# Patient Record
Sex: Female | Born: 1950 | Race: Black or African American | Hispanic: No | Smoking: Never smoker
Health system: Southern US, Community
[De-identification: ages and names within clinical notes are randomized; demographics above are authoritative.]

## PROBLEM LIST (undated history)

## (undated) DIAGNOSIS — K219 Gastro-esophageal reflux disease without esophagitis: Secondary | ICD-10-CM

## (undated) DIAGNOSIS — N189 Chronic kidney disease, unspecified: Secondary | ICD-10-CM

## (undated) DIAGNOSIS — M1990 Unspecified osteoarthritis, unspecified site: Secondary | ICD-10-CM

## (undated) HISTORY — PX: ABDOMINAL HYSTERECTOMY: SHX81

## (undated) HISTORY — DX: Unspecified osteoarthritis, unspecified site: M19.90

## (undated) HISTORY — PX: KIDNEY SURGERY: SHX687

## (undated) HISTORY — PX: CHOLECYSTECTOMY: SHX55

## (undated) HISTORY — DX: Gastro-esophageal reflux disease without esophagitis: K21.9

## (undated) HISTORY — DX: Chronic kidney disease, unspecified: N18.9

---

## 2013-10-08 ENCOUNTER — Ambulatory Visit (INDEPENDENT_AMBULATORY_CARE_PROVIDER_SITE_OTHER): Payer: Medicare Other

## 2013-10-08 VITALS — BP 109/83 | HR 80 | Resp 18

## 2013-10-08 DIAGNOSIS — B351 Tinea unguium: Secondary | ICD-10-CM

## 2013-10-08 DIAGNOSIS — L97509 Non-pressure chronic ulcer of other part of unspecified foot with unspecified severity: Secondary | ICD-10-CM

## 2013-10-08 DIAGNOSIS — G629 Polyneuropathy, unspecified: Secondary | ICD-10-CM

## 2013-10-08 DIAGNOSIS — M79609 Pain in unspecified limb: Secondary | ICD-10-CM

## 2013-10-08 DIAGNOSIS — I739 Peripheral vascular disease, unspecified: Secondary | ICD-10-CM

## 2013-10-08 NOTE — Progress Notes (Signed)
   Subjective:    Patient ID: Wanda Deleon, female    DOB: 1950-10-10, 63 y.o.   MRN: [id number]  HPI I had cellulitis on my left big toe and that was about a month ago and I used all the antibotics that they gave me and started to use vasoline cocoa butter and the nail is coming close to coming off and I saw Dr. Moshe Cipro my family doctor and went to wound clinic the one at [place] and they said to come here and then I wore a pair of shoes on the right foot and the wound clinic treated me for my right foot and then they released me and I also need my toenails trimmed but I am not a diabetic but I do go to dialysis     Review of Systems  All other systems reviewed and are negative.      Objective:   Physical Exam March the objective findings as follows vascular status DP thready at one over 4 bilateral PT nonpalpable bilateral temperature warm to cool turgor diminished there is no edema rubor pallor noted there is mild + edema noted both feet no rubor pallor noted neurologically epicritic and proprioceptive sensations appear to be intact although diminished on Semmes Weinstein testing to forefoot digits patient peripheral neuropathy affecting her hands as well patient is been on dialysis for more than 20 years steroid diagnosed with diabetes however cannot rule out as your dialysis in managing her diabetic a prediabetic state. Nails thick brittle crumbly criptotic incurvated friable 1 through 5 bilateral first left has been treated fairly have localized cellulitis which is treated with her antibiotics different treatments and this time the majority the nail plate is completely avulsed from the nailbed and minimal debridement as required remove the nail this time. There is cleansed with all cleansed Silvadene and gauze and Band-Aid dressing applied to the left hallux nail bed. There is also ulceration first MTP joint right about 2 mm ulceration down to the dermal subdermal junction was mild serous  drainage there is resolved ulcer the dorsum of the right foot her fourth metatarsal area with no active discharge or drainage noted mild flexible digital contractures are noted no other active open wounds or ulcers are noted of the first MTP area right       Assessment & Plan:  Assessment this time patient is that of diabetes does have or peripheral neuropathy as well as peripheral arterial disease and vascular compromise this time mycotic nails painful tender symptomatic 1 through 5 bilateral are debrided the left hallux nail plate is grade or debridement of the avulsed the nailbed is with all cleansed and Silvadene and Band-Aid dressing being applied. The ulcer sub-first left is also first right is also debrided and dressed with Silvadene and Band-Aid dressing maintain accommodative shoes no barefoot or flimsy shoes or flip-flops patient is again will follow him to dialysis we discussed the possibilities of other treatments however based on her age her history dialysis etc. she got strong candidate for other invasive options are treatments recheck in 2-3 months for followup and keep palliative care in the future maintain Silvadene gauze dressing starting have Silvadene home for the first MTP area right as well as the hallux nail that is not resolve completely within the next 3 or 4 weeks come in sooner again otherwise 2-3 month long-term followup for nail care  Alvan Dame DPM

## 2013-10-08 NOTE — Patient Instructions (Signed)

## 2013-10-29 ENCOUNTER — Ambulatory Visit (INDEPENDENT_AMBULATORY_CARE_PROVIDER_SITE_OTHER): Payer: Medicare Other

## 2013-10-29 ENCOUNTER — Other Ambulatory Visit: Payer: Self-pay

## 2013-10-29 VITALS — BP 128/72 | HR 87 | Temp 99.3°F | Resp 18

## 2013-10-29 DIAGNOSIS — L03119 Cellulitis of unspecified part of limb: Secondary | ICD-10-CM

## 2013-10-29 DIAGNOSIS — L97509 Non-pressure chronic ulcer of other part of unspecified foot with unspecified severity: Secondary | ICD-10-CM

## 2013-10-29 DIAGNOSIS — G609 Hereditary and idiopathic neuropathy, unspecified: Secondary | ICD-10-CM

## 2013-10-29 DIAGNOSIS — I739 Peripheral vascular disease, unspecified: Secondary | ICD-10-CM

## 2013-10-29 DIAGNOSIS — G629 Polyneuropathy, unspecified: Secondary | ICD-10-CM

## 2013-10-29 DIAGNOSIS — L02619 Cutaneous abscess of unspecified foot: Secondary | ICD-10-CM

## 2013-10-29 MED ORDER — CEPHALEXIN 500 MG PO CAPS: 500.0000 mg | ORAL_CAPSULE | Freq: Two times a day (BID) | ORAL | Status: DC

## 2013-10-29 MED ORDER — CIPROFLOXACIN HCL 250 MG PO TABS: 250.0000 mg | ORAL_TABLET | Freq: Two times a day (BID) | ORAL | Status: AC

## 2013-10-29 NOTE — Patient Instructions (Signed)

## 2013-10-29 NOTE — Progress Notes (Signed)
   Subjective:    Patient ID: Wanda Deleon, female    DOB: 07/16/1950, 63 y.o.   MRN: [id number]  HPI MY BIG TOENAIL IS TENDER AND RED AND SWOLLEN SOME AND SOME DRAINING TO IT    Review of Systems no new findings or systemic changes at this time    Objective:   Physical Exam 63 year old African Richard M. since this time for followup patient appears to be well-nourished well-developed oriented x3 however is on dialysis does have profound neuropathy the left great toe which he previously had partial nail avulsion has worsened over time patient Baird LyonsCasey was doing well until about 6 or 7 days ago and started to get darkened and discolored and more painful painful tender symptomatically on palpation at the proximal IP joint of the hallux plantar and medial and extending to the dorsum there is exfoliation skin or blistered skin around the entire proximal medial and lateral nail folds the distal tuft of the hallux appears to have a hemorrhagic blister or blood blister or hematoma cannot rule out a contusion patient is unaware bumping her hitting her toe however there is significant exfoliation of skin with malodor is serous drainage being noted to the medial IP joint area appears to be most significant with malodor and at this time following cleansing of deep swab of the deepest pocket was carried out for culture and sensitivity. The necrotic friable skin is debrided from the dorsal medial lateral surfaces plantar surface of the hallux it does go down to dermal level only no sinus tract is noted on the bone or capsule however the foot show significant signs of ischemic change again nonpalpable pedal pulses at this time DP or PT plus one the +2 edema noted I do feel there is concern for change in her vascular status patient seen a vascular clinic in [place] at this time is encouraged to followup with him ASAP she will contact me her today or tomorrow also contact our office so we can intercede and provide  appropriate referral if needed patient however is encouraged to get recheck for her vascular status as she is at risk for loss of her toe and gangrenous changes to her toe.       Assessment & Plan:  Assessment this time peripheral vascular disease peripheral neuropathy with ulceration of the hallux and hallux nail bed power appears to have a new injury or contusion of toe with subsequent blistering and abscess medial plantar dorsal of toe and distal tuft of the toe the necrotic tissue was debrided restricted for cephalexin and Cipro are reordered at this time for her pharmacy. Patient will maintain daily cleansing with antibacterial soap and water and Silvadene and gauze dressing changes patient will be recheck within 2 weeks to be out of town next week however if she has any further complications any worsening in severity fever chills increased drainage or bleeding she is to immediately to the emergency room or report to her renal doctor at dialysis. At this recheck in 2 weeks for followup antibiotic regimen initiated will continue with cleansing soap and water or Epsom salts and water, dry the area well and replace Silvadene and gauze dressings next trigger for Alvan Dameichard Sikora DPM

## 2013-11-02 LAB — CULTURE, ROUTINE-ABSCESS: Gram Stain: NONE SEEN

## 2013-11-12 ENCOUNTER — Ambulatory Visit: Payer: Medicare Other

## 2014-01-07 ENCOUNTER — Ambulatory Visit: Payer: Medicare Other

## 2014-03-25 ENCOUNTER — Ambulatory Visit (INDEPENDENT_AMBULATORY_CARE_PROVIDER_SITE_OTHER): Payer: Medicare Other

## 2014-03-25 VITALS — BP 162/79 | HR 86 | Resp 12

## 2014-03-25 DIAGNOSIS — L02619 Cutaneous abscess of unspecified foot: Secondary | ICD-10-CM

## 2014-03-25 DIAGNOSIS — B351 Tinea unguium: Secondary | ICD-10-CM

## 2014-03-25 DIAGNOSIS — I739 Peripheral vascular disease, unspecified: Secondary | ICD-10-CM

## 2014-03-25 DIAGNOSIS — L03119 Cellulitis of unspecified part of limb: Secondary | ICD-10-CM

## 2014-03-25 DIAGNOSIS — G629 Polyneuropathy, unspecified: Secondary | ICD-10-CM

## 2014-03-25 DIAGNOSIS — M79676 Pain in unspecified toe(s): Secondary | ICD-10-CM

## 2014-03-25 NOTE — Progress Notes (Signed)
   Subjective:    Patient ID: Wanda Deleon, female    DOB: 12/20/1950, 63 y.o.   MRN: [id number]  HPI ''TOENAILS TRIM.'' B/L  PT STATED LAST TIME SEEING DR.BRITT ORDERED VASCULAR DONE AND FINE OUT LT FOOT GREAT TOE HAVE BONE INFECTION. PT IS SEND FOR EMERGENCY LT FOOT GREAT TOE [facility] June.   Review of Systemsno new findings or systemic changes noted     Objective:   Physical Exam [name] options this time for follow-up since he was here last in June has had amputation of her left great toe. Apparently last visit here she did have infection the toes it started however when she left she was seen by primary physician and was placed in the hospital where Doppler studies were done and patient underwent amputation of the toe for bone infection. Patient was hospital plus repair time on antibiotics and dressing changes the hallux indications site left foot is healed well and teased have thick brittle crumbly dystrophic criptotic nails 9 the assistance of a walker continues to have notable HAV deformity with keratoses first MTP area right no open wounds no ulcers no secondary infections current time is dark discolored brittle crumbly friable tender both on palpation with enclosed shoe wear. Pedal pulses are difficult are thready nonpalpable at this time DP or PT edema is +1 noted skin temperature is warm no signs of fever chills no signs of infections are noted       Assessment & Plan:  Assessment this time history peripheral vascular compromise as well as peripheral neuropathy ulceration of left hallux and up with an amputation of left great toe however this time continues to have mycotic brittle crumbly dystrophic nails on through 5 right 2 through 5 left these are debrided at this time. Maintaining cream or lotion to the bunion site where there is some mild keratoses although no open wounds no ulcers no secondary infection is noted did  dispensed some tube foam padding to cushion her bunion shield for her bunion of her right foot and recommend wide accommodative shoes at all times. Recheck in 3 months for follow-up and continued palliative nail care in the future as needed contact nails debrided 9   Alvan Dameichard Arlicia Paquette DPM

## 2014-03-25 NOTE — Patient Instructions (Signed)

## 2014-06-17 ENCOUNTER — Ambulatory Visit (INDEPENDENT_AMBULATORY_CARE_PROVIDER_SITE_OTHER): Payer: Medicare Other

## 2014-06-17 VITALS — BP 132/92 | HR 86 | Resp 12

## 2014-06-17 DIAGNOSIS — L02619 Cutaneous abscess of unspecified foot: Secondary | ICD-10-CM

## 2014-06-17 DIAGNOSIS — L03119 Cellulitis of unspecified part of limb: Secondary | ICD-10-CM

## 2014-06-17 DIAGNOSIS — G629 Polyneuropathy, unspecified: Secondary | ICD-10-CM | POA: Diagnosis not present

## 2014-06-17 DIAGNOSIS — R238 Other skin changes: Secondary | ICD-10-CM | POA: Diagnosis not present

## 2014-06-17 DIAGNOSIS — I739 Peripheral vascular disease, unspecified: Secondary | ICD-10-CM

## 2014-06-17 MED ORDER — CEPHALEXIN 500 MG PO CAPS: 500.0000 mg | ORAL_CAPSULE | Freq: Two times a day (BID) | ORAL | Status: AC

## 2014-06-17 MED ORDER — SILVER SULFADIAZINE 1 % EX CREA: 1.0000 "application " | TOPICAL_CREAM | Freq: Every day | CUTANEOUS | Status: AC

## 2014-06-17 NOTE — Patient Instructions (Signed)
ANTIBACTERIAL SOAP INSTRUCTIONS  THE DAY AFTER PROCEDURE  Please follow the instructions your doctor has marked.   Shower as usual. Before getting out, place a drop of antibacterial liquid soap (Dial) on a wet, clean washcloth.  Gently wipe washcloth over affected area.  Afterward, rinse the area with warm water.  Blot the area dry with a soft cloth and cover with antibiotic ointment (neosporin, polysporin, bacitracin) and band aid or gauze and tape  Place 3-4 drops of antibacterial liquid soap in a quart of warm tap water.  Submerge foot into water for 20 minutes.  If bandage was applied after your procedure, leave on to allow for easy lift off, then remove and continue with soak for the remaining time.  Next, blot area dry with a soft cloth and cover with a bandage.  Apply other medications as directed by your doctor, such as cortisporin otic solution (eardrops) or neosporin antibiotic ointment   Wash the foot daily dry thoroughly apply Silvadene and gauze dressing daily as instructed

## 2014-06-17 NOTE — Progress Notes (Signed)
   Subjective:    Patient ID: Rondell ReamsGearldlyn Berrian, female    DOB: 02/12/1951, 64 y.o.   MRN: [id number]  HPI   RT FOOT ULCER IS [facility].  ALSO, RT FOOT HAVE NEW BLISTER BOTTOM OF THE FOOT AFTER WEARING REEBOK SHOES AND THE BLISTER HAVE REDNESS AND DRANIGE FOR 3 DAYS. TRIED NO TREATMENT.   Review of Systems  Cardiovascular: Positive for leg swelling.  Musculoskeletal: Positive for gait problem.  All other systems reviewed and are negative.      Objective:   Physical Exam Vascular status unchanged this is a 64 year old afterward the options this time for follow-up previously for debridement/is scheduled for nail debridement next week however has developed a blister over the medial and plantar surface of her right foot has an ulcer about half centimeter diameter the first MTP area dorsal medial however proximal to that and extending for at least 8-10 cm with about a 3 cm 4 cm with was a large bullous lesion down to the dermal level this is not a folded partial-thickness blister through multiple layers of's epidermal and dermal layers. Mild serous drainage no purulence no malodor although the area somewhat warm to touch and palpation. No history of injury trauma although she indicates she were [employer] shoe which may have rubbed or injured this area. Her of rubber clogs injection do not fit appropriately. Patient is a history of peripheral neuropathy and angiopathy status post appendectomy dictation left hallux pulses thready nonpalpable DP or PT thready nonpalpable pulses bilateral's skin temperature warm to cool on the left warm on the right particular run the first MTP area no ascending cellulitis or lymphangitis localized cellulitis identified. S1 pitting noted rigid digital contractures are identified.       Assessment & Plan:  Assessment there is history peripheral neuropathy and angiopathy [place] compromise at this time and the blisters identified weathers traumatic or nontraumatic  insert determined multiple areas of skin are debrided away at this time with some mild serous drainage being identified has been draining or bleeding for couple of days nail was likely contaminated blister cannot rule out an infected blister of the right foot. This blister/large ulcer at least 10 x 3 cm in diameter with a 1 cm deep ulcer down to subcutaneous tissue level at the first MTP joint being noted these areas are debrided cleansed with all cleanse Silvadene gauze dressing applied to the entire right forefoot midfoot. Week for follow-up prescription for cephalexin is given a prescription for Silvadene is also given instructions for wound care daily are also dispensed reappointed one week plan for nail care and debridement at that visit as well as ulcer check. Contact us immediately or go to the emergency room if there's any exacerbations any systemic signs of fever chills etc. Extended Darco shoe to help offload pressure on the area as her other shoes do not fit appropriately  Alvan Dameichard Felipe Paluch DPM

## 2014-06-24 ENCOUNTER — Ambulatory Visit: Payer: Medicare Other

## 2014-06-24 ENCOUNTER — Ambulatory Visit (INDEPENDENT_AMBULATORY_CARE_PROVIDER_SITE_OTHER): Payer: Medicare Other

## 2014-06-24 VITALS — BP 110/60 | HR 86 | Resp 12

## 2014-06-24 DIAGNOSIS — I739 Peripheral vascular disease, unspecified: Secondary | ICD-10-CM

## 2014-06-24 DIAGNOSIS — L03119 Cellulitis of unspecified part of limb: Secondary | ICD-10-CM | POA: Diagnosis not present

## 2014-06-24 DIAGNOSIS — L02619 Cutaneous abscess of unspecified foot: Secondary | ICD-10-CM | POA: Diagnosis not present

## 2014-06-24 DIAGNOSIS — G629 Polyneuropathy, unspecified: Secondary | ICD-10-CM | POA: Diagnosis not present

## 2014-06-24 DIAGNOSIS — M86171 Other acute osteomyelitis, right ankle and foot: Secondary | ICD-10-CM

## 2014-06-24 DIAGNOSIS — R238 Other skin changes: Secondary | ICD-10-CM

## 2014-06-24 NOTE — Addendum Note (Signed)
Addended by: Shelly BombardPOLK, Natan Hartog M on: 06/24/2014 04:19 PM   Modules accepted: Orders

## 2014-06-24 NOTE — Progress Notes (Signed)
   Subjective:    Patient ID: Wanda Deleon, female    DOB: 07/13/1950, 64 y.o.   MRN: [id number]  HPI  ''RT FOOT IS LOOKING A LITTLE BETTER AND JUST THE ONE SPOT STILL LOOKS THE SAME. ALSO, THE SKIN IS PEELING OFF SOME OF THE AREA OF THE FOOT.''  Review of Systems no new findings or systemic changes noted    Objective:   Physical Exam Lower extremity findings unchanged patient does have blistering of right foot with a draining serosanguineous abscess or sinus tract exiting from the first MTP joint of the right great toe. There is localized edema and erythema and a size lymphangitis significant fluctuance around the first MTP and plantar foot and forefoot are noted. Her is crepitus on movement of the hallux likely bony erosion of the MTP joint possible acute osteomyelitis and deterioration of the MTP joint right foot. She has profound neuropathy. Has been on antibiotic regimen however by recommendation at this time is [facility] recommendations for hospital admission with IV antibiotic therapies and a workup for foot abscess and ulceration.      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 She is referred to the hospital immediately will drive herself at this time will make contact with the ER to announce her arrival at the emergency room. Again recommendation will be for likely admit IV antibiotic therapies and likely surgical debridement by orthotic or podiatry. Furnish my phone number to the patient to be available should there be any questions about her past or recent history. At this time the wound site is dressed with Silvadene and a gauze dressing patient ambulating with assistance of a walker. To have significant arthropathy as well as again angiopathy and peripheral neuropathy. Stressed the importance that she needs to go to the emergency room  immediately to initiate appropriate antibiotic therapies and treatments. Patient was given the option of being admitted here, hospital however this would be far from her home and family. Patient elects to be near home.    Alvan Dameichard Presley Gora DPM

## 2014-06-24 NOTE — Patient Instructions (Signed)
Patient is referred at this time to the emergency room, [facility]  Patient does have a history of diabetes with neuropathy and peripheral vascular compromise. Patient had blistering of foot which is progressed to likely cellulitis and abscess of first MTP joint. There is significant drainage and sinus tract exiting from first MTP joint right foot with likely bone erosion and likely acute osteomyelitis.  Recommendation is for hospitalization with IV antibiotics, will likely need surgical debridement or possible amputation. Need vascular and radiographic evaluation of lower extremities. Likely acute osteomyelitis of right great toe joint with a septic joint or abscess of right forefoot being noted.  Wanda Deleon DPM [phone]  cell

## 2014-06-27 LAB — CULTURE, ROUTINE-ABSCESS
Gram Stain: NONE SEEN
Gram Stain: NONE SEEN

## 2016-10-26 ENCOUNTER — Inpatient Hospital Stay
Admission: AD | Admit: 2016-10-26 | Discharge: 2016-11-13 | Disposition: A | Discharge status: Skilled Nursing Facility | Payer: Self-pay | Source: Other Acute Inpatient Hospital | Attending: Internal Medicine | Admitting: Internal Medicine

## 2016-10-26 DIAGNOSIS — R229 Localized swelling, mass and lump, unspecified: Secondary | ICD-10-CM

## 2016-10-26 DIAGNOSIS — Z978 Presence of other specified devices: Secondary | ICD-10-CM

## 2016-10-26 DIAGNOSIS — Z4803 Encounter for change or removal of drains: Secondary | ICD-10-CM

## 2016-10-26 DIAGNOSIS — L0291 Cutaneous abscess, unspecified: Secondary | ICD-10-CM

## 2016-10-26 DIAGNOSIS — T85698S Other mechanical complication of other specified internal prosthetic devices, implants and grafts, sequela: Secondary | ICD-10-CM

## 2016-10-26 DIAGNOSIS — N2889 Other specified disorders of kidney and ureter: Secondary | ICD-10-CM

## 2016-10-26 DIAGNOSIS — IMO0002 Reserved for concepts with insufficient information to code with codable children: Secondary | ICD-10-CM

## 2016-10-27 LAB — CBC
HEMATOCRIT: 26.1 % — AB (ref 36.0–46.0)
HEMOGLOBIN: 8.1 g/dL — AB (ref 12.0–15.0)
MCH: 27.2 pg (ref 26.0–34.0)
MCHC: 31 g/dL (ref 30.0–36.0)
MCV: 87.6 fL (ref 78.0–100.0)
Platelets: 403 10*3/uL — ABNORMAL HIGH (ref 150–400)
RBC: 2.98 MIL/uL — AB (ref 3.87–5.11)
RDW: 16.4 % — ABNORMAL HIGH (ref 11.5–15.5)
WBC: 19.6 10*3/uL — AB (ref 4.0–10.5)

## 2016-10-27 LAB — COMPREHENSIVE METABOLIC PANEL
ALT: 218 U/L — AB (ref 14–54)
AST: 217 U/L — AB (ref 15–41)
Albumin: 2 g/dL — ABNORMAL LOW (ref 3.5–5.0)
Alkaline Phosphatase: 364 U/L — ABNORMAL HIGH (ref 38–126)
Anion gap: 13 (ref 5–15)
BILIRUBIN TOTAL: 0.4 mg/dL (ref 0.3–1.2)
BUN: 25 mg/dL — AB (ref 6–20)
CO2: 25 mmol/L (ref 22–32)
CREATININE: 3.62 mg/dL — AB (ref 0.44–1.00)
Calcium: 9.5 mg/dL (ref 8.9–10.3)
Chloride: 95 mmol/L — ABNORMAL LOW (ref 101–111)
GFR calc Af Amer: 14 mL/min — ABNORMAL LOW (ref >60)
GFR, EST NON AFRICAN AMERICAN: 12 mL/min — AB (ref >60)
Glucose, Bld: 188 mg/dL — ABNORMAL HIGH (ref 65–99)
Potassium: 5.1 mmol/L (ref 3.5–5.1)
Sodium: 133 mmol/L — ABNORMAL LOW (ref 135–145)
TOTAL PROTEIN: 8.9 g/dL — AB (ref 6.5–8.1)

## 2016-10-28 LAB — CBC WITH DIFFERENTIAL/PLATELET
Basophils Absolute: 0.1 10*3/uL (ref 0.0–0.1)
Basophils Relative: 0 %
EOS ABS: 0.5 10*3/uL (ref 0.0–0.7)
EOS PCT: 2 %
HCT: 22.6 % — ABNORMAL LOW (ref 36.0–46.0)
Hemoglobin: 7.3 g/dL — ABNORMAL LOW (ref 12.0–15.0)
LYMPHS ABS: 1.1 10*3/uL (ref 0.7–4.0)
Lymphocytes Relative: 5 %
MCH: 28.1 pg (ref 26.0–34.0)
MCHC: 32.3 g/dL (ref 30.0–36.0)
MCV: 86.9 fL (ref 78.0–100.0)
MONO ABS: 1.9 10*3/uL — AB (ref 0.1–1.0)
Monocytes Relative: 9 %
Neutro Abs: 18.8 10*3/uL — ABNORMAL HIGH (ref 1.7–7.7)
Neutrophils Relative %: 84 %
PLATELETS: 453 10*3/uL — AB (ref 150–400)
RBC: 2.6 MIL/uL — ABNORMAL LOW (ref 3.87–5.11)
RDW: 16.2 % — AB (ref 11.5–15.5)
WBC: 22.3 10*3/uL — ABNORMAL HIGH (ref 4.0–10.5)

## 2016-10-28 LAB — BASIC METABOLIC PANEL
ANION GAP: 12 (ref 5–15)
BUN: 33 mg/dL — ABNORMAL HIGH (ref 6–20)
CHLORIDE: 97 mmol/L — AB (ref 101–111)
CO2: 26 mmol/L (ref 22–32)
Calcium: 8.9 mg/dL (ref 8.9–10.3)
Creatinine, Ser: 4.92 mg/dL — ABNORMAL HIGH (ref 0.44–1.00)
GFR calc Af Amer: 10 mL/min — ABNORMAL LOW (ref >60)
GFR calc non Af Amer: 8 mL/min — ABNORMAL LOW (ref >60)
Glucose, Bld: 145 mg/dL — ABNORMAL HIGH (ref 65–99)
POTASSIUM: 5.5 mmol/L — AB (ref 3.5–5.1)
Sodium: 135 mmol/L (ref 135–145)

## 2016-10-28 LAB — RENAL FUNCTION PANEL
Albumin: 1.9 g/dL — ABNORMAL LOW (ref 3.5–5.0)
Anion gap: 13 (ref 5–15)
BUN: 33 mg/dL — AB (ref 6–20)
CHLORIDE: 96 mmol/L — AB (ref 101–111)
CO2: 25 mmol/L (ref 22–32)
Calcium: 8.9 mg/dL (ref 8.9–10.3)
Creatinine, Ser: 4.97 mg/dL — ABNORMAL HIGH (ref 0.44–1.00)
GFR calc non Af Amer: 8 mL/min — ABNORMAL LOW (ref >60)
GFR, EST AFRICAN AMERICAN: 10 mL/min — AB (ref >60)
GLUCOSE: 144 mg/dL — AB (ref 65–99)
Phosphorus: 4.7 mg/dL — ABNORMAL HIGH (ref 2.5–4.6)
Potassium: 5.5 mmol/L — ABNORMAL HIGH (ref 3.5–5.1)
Sodium: 134 mmol/L — ABNORMAL LOW (ref 135–145)

## 2016-10-28 LAB — MAGNESIUM: Magnesium: 2 mg/dL (ref 1.7–2.4)

## 2016-10-29 ENCOUNTER — Other Ambulatory Visit (HOSPITAL_COMMUNITY): Payer: Self-pay

## 2016-10-29 LAB — CBC WITH DIFFERENTIAL/PLATELET
Basophils Absolute: 0.1 10*3/uL (ref 0.0–0.1)
Basophils Relative: 0 %
EOS PCT: 2 %
Eosinophils Absolute: 0.4 10*3/uL (ref 0.0–0.7)
HCT: 21.4 % — ABNORMAL LOW (ref 36.0–46.0)
Hemoglobin: 7 g/dL — ABNORMAL LOW (ref 12.0–15.0)
LYMPHS ABS: 1.4 10*3/uL (ref 0.7–4.0)
LYMPHS PCT: 6 %
MCH: 28.1 pg (ref 26.0–34.0)
MCHC: 32.7 g/dL (ref 30.0–36.0)
MCV: 85.9 fL (ref 78.0–100.0)
MONO ABS: 2 10*3/uL — AB (ref 0.1–1.0)
Monocytes Relative: 9 %
Neutro Abs: 18.4 10*3/uL — ABNORMAL HIGH (ref 1.7–7.7)
Neutrophils Relative %: 83 %
PLATELETS: 485 10*3/uL — AB (ref 150–400)
RBC: 2.49 MIL/uL — ABNORMAL LOW (ref 3.87–5.11)
RDW: 16 % — ABNORMAL HIGH (ref 11.5–15.5)
WBC: 22.3 10*3/uL — ABNORMAL HIGH (ref 4.0–10.5)

## 2016-10-29 LAB — RENAL FUNCTION PANEL
Albumin: 1.9 g/dL — ABNORMAL LOW (ref 3.5–5.0)
Anion gap: 14 (ref 5–15)
BUN: 42 mg/dL — AB (ref 6–20)
CHLORIDE: 97 mmol/L — AB (ref 101–111)
CO2: 24 mmol/L (ref 22–32)
CREATININE: 6.69 mg/dL — AB (ref 0.44–1.00)
Calcium: 8.6 mg/dL — ABNORMAL LOW (ref 8.9–10.3)
GFR calc Af Amer: 7 mL/min — ABNORMAL LOW (ref >60)
GFR, EST NON AFRICAN AMERICAN: 6 mL/min — AB (ref >60)
Glucose, Bld: 126 mg/dL — ABNORMAL HIGH (ref 65–99)
Phosphorus: 5.2 mg/dL — ABNORMAL HIGH (ref 2.5–4.6)
Potassium: 5.4 mmol/L — ABNORMAL HIGH (ref 3.5–5.1)
Sodium: 135 mmol/L (ref 135–145)

## 2016-10-29 LAB — PREPARE RBC (CROSSMATCH)

## 2016-10-29 LAB — ABO/RH: ABO/RH(D): O POS

## 2016-10-29 LAB — HEPATITIS B SURFACE ANTIGEN: Hepatitis B Surface Ag: NEGATIVE

## 2016-10-29 LAB — MAGNESIUM: MAGNESIUM: 2.3 mg/dL (ref 1.7–2.4)

## 2016-10-29 LAB — PROCALCITONIN: Procalcitonin: 6.43 ng/mL

## 2016-10-29 MED ORDER — IOPAMIDOL (ISOVUE-300) INJECTION 61%
INTRAVENOUS | Status: AC
  Filled 2016-10-29: qty 30

## 2016-10-29 NOTE — Consult Note (Signed)
[facility] CONSULT NOTE    Date: 10/29/2016                  Patient Name:  Wanda Deleon  MRN: [id number]  DOB: [birth year]  Age / Sex: 66 y.o., female         PCP: Oval Linsey, PA-C                 Service Requesting Consult: Hospitalist                 Reason for Consult: End Stage renal disease on hemodialysis            History of Present Illness: Patient is a 66 y.o. female with a PMHx of intestinal ischemia status post colostomy placement, [place], anemia of chronic disease, chronic diastolic heart, GERD, diabetes mellitus type 2, debility, who was recently admitted to the hospital in [facility].  As above she presented with ischemic colitis and ended up having colostomy placement with right-sided colectomy.  She had septic shock initially. She was followed by nephrology for her underlying end-stage renal disease. She was treated with antibiotics including Zosyn and Diflucan.  She is seen and evaluated during hemodialysis today. She appears to be tolerating this well. "Target today is 1.5 kg.  Patient has been on dialysis for 20 years. She did not have a renal transplant in the past.  Medications from discharge: acetaminophen (TYLENOL) 325 mg tablet Take 2 tablets (650 mg total) by mouth every 4 (four) hours as needed for mild pain, moderate pain or fever.  aspirin (ECOTRIN) 81 mg EC tablet Take 81 mg by mouth daily.  CINACALCET HCL (SENSIPAR ORAL) Take 120 mg by mouth daily with dinner. With dinner   clonazePAM (KlonoPIN) 0.5 mg tablet Take 1 tablet (0.5 mg total) by mouth 2 (two) times a day as needed for anxiety.  diphenhydrAMINE (BENADRYL) 25 mg capsule Take 1 capsule (25 mg total) by mouth as needed (itching or signs/symptoms of dialyzer reaction).  epoetin alfa (EPOGEN,PROCRIT) 10,000 unit/mL injection Inject 1 mL (10,000 Units total) under the skin Daily with dialysis treatment (3x/week with HD)  Indications: ESRD on Dialysis.  heparin, porcine, PF, (HEPARIN LOCKFLUSH,PORCINE,,PF,) 10 unit/mL injection Infuse 5 mL (50 Units total) into a venous catheter daily.  insulin lispro (HumaLOG) 100 unit/mL injection Inject 0-10 Units under the skin every 6 (six) hours.  lidocaine-prilocaine (EMLA) 2.5-2.5 % cream Apply topically as needed for moderate pain (at HD site).  omeprazole (PriLOSEC) 20 mg capsule Take 20 mg by mouth daily.  oxyCODONE (ROXICODONE) 5 mg immediate release tablet Take 1 tablet (5 mg total) by mouth every 4 (four) hours as needed for severe pain for up to 20 doses Earliest Fill Date: 10/26/16.  rosuvastatin (CRESTOR) 20 mg tablet Take 10 mg by mouth daily.   TPN AT DISCHARGE Infuse into a venous catheter continuously.  Allergies: Allergies  Allergen Reactions  . Tape     adhesive      Past Medical History: Past Medical History:  Diagnosis Date  . Arthritis   . Chronic kidney disease   . GERD (gastroesophageal reflux disease)   ESRD Anemia of CKD Secondary hyperparathyroidism Right internal jugular PermCath Ischemic colitis status post right hemicolectomy with colostomy placement Diabetes mellitus type 2   Past Surgical History: Past Surgical History:  Procedure Laterality Date  . ABDOMINAL HYSTERECTOMY    . CHOLECYSTECTOMY    . KIDNEY SURGERY  right-sided hemicolectomy. Colostomy placement Right below-the-knee amputation Hysterectomy Appendectomy    Family History: Patient's father had history of kidney failure. Mother had history of hypertension.  Social History: [place], [place]. Denies tobacco, alcohol, or illicit drug use.   Review of Systems: Review of Systems  Constitutional: Positive for malaise/fatigue and weight loss. Negative for chills and fever.  HENT: Negative for congestion, hearing loss and nosebleeds.   Eyes: Negative for blurred vision and double vision.  Respiratory: Negative for cough,  hemoptysis and sputum production.   Cardiovascular: Negative for chest pain, palpitations and orthopnea.  Gastrointestinal: Positive for abdominal pain. Negative for heartburn and nausea.  Genitourinary: Negative for dysuria, frequency and urgency.  Musculoskeletal: Negative for myalgias.  Skin: Negative for itching.  Neurological: Positive for weakness. Negative for dizziness and focal weakness.  Endo/Heme/Allergies: Negative for polydipsia. Does not bruise/bleed easily.  Psychiatric/Behavioral: Negative for depression. The patient is nervous/anxious.      Vital Signs: There were no vitals taken for this visit.  Weight trends: There were no vitals filed for this visit.  Physical Exam: General: NAD, resting in bed  Head: Normocephalic, atraumatic.  Eyes: Anicteric, EOMI  Nose: Mucous membranes moist, not inflammed, nonerythematous.  Throat: Oropharynx nonerythematous, no exudate appreciated.   Neck: Supple, trachea midline.  Lungs:  Normal respiratory effort. Clear to auscultation BL without crackles or wheezes.  Heart: RRR. S1 and S2 normal without gallop, murmur, or rubs.  Abdomen:  Colostomy in place, abdomen soft, non tender  Extremities: Right below the knee amputation, trace edema in LLE  Neurologic: A&O X3, Motor strength is 5/5 in the all 4 extremities  Skin: No visible rashes, scars.    Lab results: Basic Metabolic Panel:  Recent Labs Lab 10/27/16 0931 10/28/16 0620 10/29/16 0615  NA 133* 135  134* 135  K 5.1 5.5*  5.5* 5.4*  CL 95* 97*  96* 97*  CO2 25 26  25 24   GLUCOSE 188* 145*  144* 126*  BUN 25* 33*  33* 42*  CREATININE 3.62* 4.92*  4.97* 6.69*  CALCIUM 9.5 8.9  8.9 8.6*  MG  --  2.0 2.3  PHOS  --  4.7* 5.2*    Liver Function Tests:  Recent Labs Lab 10/27/16 0931 10/28/16 0620 10/29/16 0615  AST 217*  --   --   ALT 218*  --   --   ALKPHOS 364*  --   --   BILITOT 0.4  --   --   PROT 8.9*  --   --   ALBUMIN 2.0* 1.9* 1.9*   No  results for input(s): LIPASE, AMYLASE in the last 168 hours. No results for input(s): AMMONIA in the last 168 hours.  CBC:  Recent Labs Lab 10/27/16 0931 10/28/16 0620 10/29/16 0615  WBC 19.6* 22.3* 22.3*  NEUTROABS  --  18.8* 18.4*  HGB 8.1* 7.3* 7.0*  HCT 26.1* 22.6* 21.4*  MCV 87.6 86.9 85.9  PLT 403* 453* 485*    Cardiac Enzymes: No results for input(s): CKTOTAL, CKMB, CKMBINDEX, TROPONINI in the last 168 hours.  BNP: Invalid input(s): POCBNP  CBG: No results for input(s): GLUCAP in the last 168 hours.  Microbiology: Results for orders placed or performed during the hospital encounter of 10/26/16  Culture, blood (routine x 2)     Status: None (Preliminary result)   Collection Time: 10/28/16  1:21 PM  Result Value Ref Range Status   Specimen Description BLOOD RIGHT ANTECUBITAL  Final   Special Requests  Final    BOTTLES DRAWN AEROBIC AND ANAEROBIC Blood Culture adequate volume   Culture NO GROWTH 1 DAY  Final   Report Status PENDING  Incomplete  Culture, blood (routine x 2)     Status: None (Preliminary result)   Collection Time: 10/28/16  1:31 PM  Result Value Ref Range Status   Specimen Description BLOOD RIGHT HAND  Final   Special Requests IN PEDIATRIC BOTTLE Blood Culture adequate volume  Final   Culture NO GROWTH 1 DAY  Final   Report Status PENDING  Incomplete    Coagulation Studies: No results for input(s): LABPROT, INR in the last 72 hours.  Urinalysis: No results for input(s): COLORURINE, LABSPEC, PHURINE, GLUCOSEU, HGBUR, BILIRUBINUR, KETONESUR, PROTEINUR, UROBILINOGEN, NITRITE, LEUKOCYTESUR in the last 72 hours.  Invalid input(s): APPERANCEUR    Imaging: Ct Abdomen Pelvis Wo Contrast  Result Date: 10/29/2016 CLINICAL DATA:  Pt had exploratory laparotomy with right colectomy done 10/10/16. Pt denies any abdominal pain, n/v/c/d EXAM: CT ABDOMEN AND PELVIS WITHOUT CONTRAST TECHNIQUE: Multidetector CT imaging of the abdomen and pelvis was performed  following the standard protocol without IV contrast. COMPARISON:  None. FINDINGS: Lower chest: Bibasilar atelectasis. Hepatobiliary: No focal liver abnormality is seen. Status post cholecystectomy. No biliary dilatation. Pancreas: Unremarkable. No pancreatic ductal dilatation or surrounding inflammatory changes. Spleen: Normal in size without focal abnormality. Adrenals/Urinary Tract: Normal adrenal glands. Prior right nephrectomy. Severely atrophic left kidney with dystrophic calcifications and multiple cysts. 3.1 x 3.6 cm hypodense mass adjacent to the upper pole of the left kidney and along the posterior margin of the splenic vein of uncertain etiology. Bladder is unremarkable. Stomach/Bowel: Prior right colectomy. Right lower quadrant enterostomy. Large heterogeneous hyperdense fluid collection along the right pericolic gutter most consistent with a large hematoma. No pneumatosis, pneumoperitoneum or portal venous gas. No bowel dilatation to suggest obstruction. Vascular/Lymphatic: Abdominal aortic atherosclerosis. No lymphadenopathy. Reproductive: Status post hysterectomy. No adnexal masses. Other: No abdominal wall hernia. Musculoskeletal: Endplate destruction of the inferior endplate of L4 and superior endplate of L5. Broad-based disc bulge and bilateral facet arthropathy at L4-5 could resulting in severe spinal stenosis. No lytic or sclerotic osseous lesion. IMPRESSION: 1. Recent right colectomy. Large hyperdense fluid collection in the right pericolic gutter most concerning for a large hematoma. 2. Endplate destruction of the inferior endplate of L4 and superior endplate of L5 which may reflect subacute or chronic discitis/osteomyelitis. 3. Broad-based disc bulge and bilateral facet arthropathy at L4-5 could resulting in severe spinal stenosis. 4. 3.1 x 3.6 cm hypodense mass adjacent to the upper pole of the left kidney and along the posterior margin of the splenic vein of uncertain etiology. Further  evaluation with a nonemergent MRI of the abdomen is recommended. Electronically Signed   By: Elige KoHetal  Patel   On: 10/29/2016 10:26      Assessment & Plan: Pt is a 66 y.o. female  with a PMHx of intestinal ischemia status post colostomy placement, [place], anemia of chronic disease, chronic diastolic heart, GERD, diabetes mellitus type 2, debility, who was recently admitted to the hospital in [facility] [place].  As above she presented with ischemic colitis and ended up having colostomy placement with right-sided colectomy.    1. ESRD on HD MWF:  The patient was dialyzing in [facility] [place] as an outpatient. We will continue the patient on a Monday, Wednesday, Friday dialysis schedule while here. Patient was seen and evaluated during hemodialysis today and tolerating well. Ultrafiltration target  today is 1.5 kg. Next also scheduled for Wednesday.  2. Anemia of chronic kidney disease. Hemoglobin drifted down over the region. Hemoglobin currently 7.0. Consider blood transfusion today. This was discussed with hospitalist.  3. Secondary hyperparathyroidism. We will assess PTH and phosphorus during the next dialysis treatment. Further plan pending these labs.  4.  Hyperkalemia. Serum potassium can't be 5.4. Recheck serum potassium on Wednesday. This should improve with dialysis.  5. Left upper pole renal mass with prior history of right nephrectomy. Noted on CT scan of the abdomen. We will check an MRI of the abdomen without contrast for further evaluation.

## 2016-10-30 LAB — CBC WITH DIFFERENTIAL/PLATELET
BASOS ABS: 0 10*3/uL (ref 0.0–0.1)
BASOS PCT: 0 %
EOS ABS: 0.4 10*3/uL (ref 0.0–0.7)
Eosinophils Relative: 2 %
HCT: 25.7 % — ABNORMAL LOW (ref 36.0–46.0)
Hemoglobin: 8.4 g/dL — ABNORMAL LOW (ref 12.0–15.0)
LYMPHS PCT: 7 %
Lymphs Abs: 1.5 10*3/uL (ref 0.7–4.0)
MCH: 28.6 pg (ref 26.0–34.0)
MCHC: 32.7 g/dL (ref 30.0–36.0)
MCV: 87.4 fL (ref 78.0–100.0)
MONO ABS: 1.7 10*3/uL — AB (ref 0.1–1.0)
Monocytes Relative: 8 %
NEUTROS PCT: 83 %
Neutro Abs: 18 10*3/uL — ABNORMAL HIGH (ref 1.7–7.7)
PLATELETS: 500 10*3/uL — AB (ref 150–400)
RBC: 2.94 MIL/uL — ABNORMAL LOW (ref 3.87–5.11)
RDW: 15.8 % — AB (ref 11.5–15.5)
WBC: 21.6 10*3/uL — AB (ref 4.0–10.5)

## 2016-10-30 LAB — BPAM RBC
Blood Product Expiration Date: 201807152359
ISSUE DATE / TIME: 201806182216
Unit Type and Rh: 5100

## 2016-10-30 LAB — TYPE AND SCREEN
ABO/RH(D): O POS
ANTIBODY SCREEN: NEGATIVE
Unit division: 0

## 2016-10-30 LAB — RENAL FUNCTION PANEL
Albumin: 2 g/dL — ABNORMAL LOW (ref 3.5–5.0)
Anion gap: 14 (ref 5–15)
BUN: 25 mg/dL — AB (ref 6–20)
CALCIUM: 8.5 mg/dL — AB (ref 8.9–10.3)
CHLORIDE: 96 mmol/L — AB (ref 101–111)
CO2: 25 mmol/L (ref 22–32)
CREATININE: 4.62 mg/dL — AB (ref 0.44–1.00)
GFR calc Af Amer: 11 mL/min — ABNORMAL LOW (ref >60)
GFR, EST NON AFRICAN AMERICAN: 9 mL/min — AB (ref >60)
Glucose, Bld: 138 mg/dL — ABNORMAL HIGH (ref 65–99)
Phosphorus: 5 mg/dL — ABNORMAL HIGH (ref 2.5–4.6)
Potassium: 4.1 mmol/L (ref 3.5–5.1)
SODIUM: 135 mmol/L (ref 135–145)

## 2016-10-30 LAB — HEMOGLOBIN A1C
Hgb A1c MFr Bld: 6.3 % — ABNORMAL HIGH (ref 4.8–5.6)
MEAN PLASMA GLUCOSE: 134 mg/dL

## 2016-10-30 LAB — MAGNESIUM: MAGNESIUM: 2.1 mg/dL (ref 1.7–2.4)

## 2016-10-30 LAB — HEPATITIS B CORE ANTIBODY, IGM: HEP B C IGM: NEGATIVE

## 2016-10-30 LAB — HEPATITIS B SURFACE ANTIBODY, QUANTITATIVE: HEPATITIS B-POST: 39.6 m[IU]/mL

## 2016-10-31 ENCOUNTER — Other Ambulatory Visit (HOSPITAL_COMMUNITY): Payer: Self-pay

## 2016-10-31 LAB — RENAL FUNCTION PANEL
ANION GAP: 16 — AB (ref 5–15)
Albumin: 2 g/dL — ABNORMAL LOW (ref 3.5–5.0)
BUN: 36 mg/dL — ABNORMAL HIGH (ref 6–20)
CO2: 23 mmol/L (ref 22–32)
Calcium: 8.3 mg/dL — ABNORMAL LOW (ref 8.9–10.3)
Chloride: 96 mmol/L — ABNORMAL LOW (ref 101–111)
Creatinine, Ser: 6.21 mg/dL — ABNORMAL HIGH (ref 0.44–1.00)
GFR calc Af Amer: 7 mL/min — ABNORMAL LOW (ref >60)
GFR calc non Af Amer: 6 mL/min — ABNORMAL LOW (ref >60)
GLUCOSE: 133 mg/dL — AB (ref 65–99)
POTASSIUM: 4.3 mmol/L (ref 3.5–5.1)
Phosphorus: 5.4 mg/dL — ABNORMAL HIGH (ref 2.5–4.6)
SODIUM: 135 mmol/L (ref 135–145)

## 2016-10-31 LAB — CBC
HEMATOCRIT: 25.6 % — AB (ref 36.0–46.0)
Hemoglobin: 8 g/dL — ABNORMAL LOW (ref 12.0–15.0)
MCH: 27.8 pg (ref 26.0–34.0)
MCHC: 31.3 g/dL (ref 30.0–36.0)
MCV: 88.9 fL (ref 78.0–100.0)
Platelets: 529 10*3/uL — ABNORMAL HIGH (ref 150–400)
RBC: 2.88 MIL/uL — ABNORMAL LOW (ref 3.87–5.11)
RDW: 16.4 % — ABNORMAL HIGH (ref 11.5–15.5)
WBC: 20.6 10*3/uL — ABNORMAL HIGH (ref 4.0–10.5)

## 2016-10-31 LAB — C DIFFICILE QUICK SCREEN W PCR REFLEX
C DIFFICILE (CDIFF) TOXIN: NEGATIVE
C Diff antigen: NEGATIVE
C Diff interpretation: NOT DETECTED

## 2016-10-31 NOTE — Progress Notes (Signed)
[facility]  ROUNDING NOTE   Subjective:  Patient seen and evaluated during hemodialysis today. She appears to be tolerating well. Her left upper extremity AV fistula is functioning well.    Objective:  Vital signs in last 24 hours:  Temperature 90.7 pulse 94 respirations 16 blood pressure 94/56  Physical Exam: General: No acute distress  Head: Normocephalic, atraumatic. Moist oral mucosal membranes  Eyes: Anicteric  Neck: Supple, trachea midline  Lungs:  Clear to auscultation, normal effort  Heart: S1S2 no rubs  Abdomen:  Soft, nontender, bowel sounds present, colostomy in place  Extremities: trace peripheral edema.  Neurologic: Awake, alert, following commands  Skin: No lesions  Access: LUE AVF    Basic Metabolic Panel:  Recent Labs Lab 10/27/16 0931 10/28/16 0620 10/29/16 0615 10/30/16 0643 10/31/16 0538  NA 133* 135  134* 135 135 135  K 5.1 5.5*  5.5* 5.4* 4.1 4.3  CL 95* 97*  96* 97* 96* 96*  CO2 25 26  25 24 25 23   GLUCOSE 188* 145*  144* 126* 138* 133*  BUN 25* 33*  33* 42* 25* 36*  CREATININE 3.62* 4.92*  4.97* 6.69* 4.62* 6.21*  CALCIUM 9.5 8.9  8.9 8.6* 8.5* 8.3*  MG  --  2.0 2.3 2.1  --   PHOS  --  4.7* 5.2* 5.0* 5.4*    Liver Function Tests:  Recent Labs Lab 10/27/16 0931 10/28/16 0620 10/29/16 0615 10/30/16 0643 10/31/16 0538  AST 217*  --   --   --   --   ALT 218*  --   --   --   --   ALKPHOS 364*  --   --   --   --   BILITOT 0.4  --   --   --   --   PROT 8.9*  --   --   --   --   ALBUMIN 2.0* 1.9* 1.9* 2.0* 2.0*   No results for input(s): LIPASE, AMYLASE in the last 168 hours. No results for input(s): AMMONIA in the last 168 hours.  CBC:  Recent Labs Lab 10/27/16 0931 10/28/16 0620 10/29/16 0615 10/30/16 0644 10/31/16 0538  WBC 19.6* 22.3* 22.3* 21.6* 20.6*  NEUTROABS  --  18.8* 18.4* 18.0*  --   HGB 8.1* 7.3* 7.0* 8.4* 8.0*  HCT 26.1* 22.6* 21.4* 25.7* 25.6*  MCV 87.6 86.9 85.9 87.4 88.9  PLT 403*  453* 485* 500* 529*    Cardiac Enzymes: No results for input(s): CKTOTAL, CKMB, CKMBINDEX, TROPONINI in the last 168 hours.  BNP: Invalid input(s): POCBNP  CBG: No results for input(s): GLUCAP in the last 168 hours.  Microbiology: Results for orders placed or performed during the hospital encounter of 10/26/16  Culture, blood (routine x 2)     Status: None (Preliminary result)   Collection Time: 10/28/16  1:21 PM  Result Value Ref Range Status   Specimen Description BLOOD RIGHT ANTECUBITAL  Final   Special Requests   Final    BOTTLES DRAWN AEROBIC AND ANAEROBIC Blood Culture adequate volume   Culture NO GROWTH 2 DAYS  Final   Report Status PENDING  Incomplete  Culture, blood (routine x 2)     Status: None (Preliminary result)   Collection Time: 10/28/16  1:31 PM  Result Value Ref Range Status   Specimen Description BLOOD RIGHT HAND  Final   Special Requests IN PEDIATRIC BOTTLE Blood Culture adequate volume  Final   Culture NO GROWTH 2 DAYS  Final  Report Status PENDING  Incomplete  C difficile quick scan w PCR reflex     Status: None   Collection Time: 10/30/16 11:53 AM  Result Value Ref Range Status   C Diff antigen NEGATIVE NEGATIVE Final   C Diff toxin NEGATIVE NEGATIVE Final   C Diff interpretation No C. difficile detected.  Final    Coagulation Studies: No results for input(s): LABPROT, INR in the last 72 hours.  Urinalysis: No results for input(s): COLORURINE, LABSPEC, PHURINE, GLUCOSEU, HGBUR, BILIRUBINUR, KETONESUR, PROTEINUR, UROBILINOGEN, NITRITE, LEUKOCYTESUR in the last 72 hours.  Invalid input(s): APPERANCEUR    Imaging: Mr Abdomen Wo Contrast  Result Date: 10/31/2016 CLINICAL DATA:  Evaluate indeterminate LEFT renal lesion. Of note patient is status post recent colectomy. This patient not hold breath or follow-up commands. EXAM: MRI ABDOMEN WITHOUT CONTRAST TECHNIQUE: Multiplanar multisequence MR imaging was performed without the administration of  intravenous contrast. Of note patient is status post recent colectomy. This patient not hold breath or follow-up commands. COMPARISON:  None. FINDINGS: Lower chest:  Lung bases are clear. Hepatobiliary: Low signal intensity within the liver and spleen suggest iron overload. Pancreas is normal signal intensity. No biliary duct dilatation. Postcholecystectomy. The common bile duct is mildly dilated 9 mm. This is common following cholecystectomy. Pancreas: Normal pancreatic parenchyma on this noncontrast exam Spleen: Low signal intensity on T2 weighted imaging within the spleen (series 7). Within the posterior aspect the spleen measuring 3.2 cm lesion which is hyperintense T2 weighted imaging (image 17, series 7 ; image 18, series 12. Lesion does restrict diffusion (image 72, series 1250). Adrenals/urinary tract: Adrenal glands are normal. There multiple round lesions within the LEFT renal cortex which are hyperintense on T2 weighted imaging consistent with a simple cyst. The lesion of concern medial to the upper pole LEFT kidney is hyperintense T2 weighted imaging (Series 27, series 10) and mildly hyperintense on T1 weighted imaging (image 84, series 1300). This lesion measures 3.6 cm by 3.0 cm. Within the RIGHT pararenal space there is a large hematoma measuring 8.6 x 7.7 cm in diameter (image 35, series 2) not changed from recent exam Stomach/Bowel: Stomach and limited of the small bowel is unremarkable Vascular/Lymphatic: Abdominal aortic normal caliber. No retroperitoneal periportal lymphadenopathy. Musculoskeletal: No aggressive osseous lesion IMPRESSION: 1. This inpatient cannot hold breath or follow commands. Recent colectomy. Follow-up contrast would be more beneficial. If patient on chronic hemodialysis, iodinated contrast with CT could be considered. 2. Round lesion adjacent to the upper pole LEFT kidney is indeterminate. Differential would include solid renal mass, hemorrhagic renal cyst, or less likely  vascular structure / aneurysm. Recommend follow-up contrast exam as above. 3. 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 Query transfusions. 4. A 3 cm lesion within the spleen is indeterminate. Differential would include benign splenic lesion versus abscess. Contrast imaging may aid in differential. 5. Stable large RIGHT retroperitoneal hematoma. These results will be called to the ordering clinician or representative by the Radiologist Assistant, and communication documented in the PACS or zVision Dashboard. Electronically Signed   By: Genevive BiStewart  Edmunds M.D.   On: 10/31/2016 10:38     Medications:       Assessment/ Plan:  66 y.o. female with a PMHx of intestinal ischemia status post colostomy placement, [place], anemia of chronic disease, chronic diastolic heart, GERD, diabetes mellitus type 2, debility, who was recently admitted to the hospital in [facility] [place].  As above she presented with ischemic  colitis and ended up having colostomy placement with right-sided colectomy.    1. ESRD on HD MWF:  The patient was dialyzing in [facility] as an outpatient.  -  Patient seen and evaluated during hemodialysis. She appears to be tolerating well. Next ulcers on Friday.  2. Anemia of chronic kidney disease. Patient received blood confusion earlier this week. Avoid Epogen given renal mass.  3. Secondary hyperparathyroidism. Phosphorus under good control. Continue to monitor.  4.  Hyperkalemia. K down to 4.3, continue to monitor.   5. Left upper pole renal mass with prior history of right nephrectomy.  MRI abdomen showed an indeterminate lesion in the left upper pole. We would advise against use of contrast given end-stage renal disease.Patient will likely need further evaluation as an outpatient with urology.   LOS: 0 Lonni Dirden 6/20/20183:[id number] PM

## 2016-11-01 LAB — RENAL FUNCTION PANEL
ALBUMIN: 2.2 g/dL — AB (ref 3.5–5.0)
ANION GAP: 14 (ref 5–15)
BUN: 23 mg/dL — ABNORMAL HIGH (ref 6–20)
CALCIUM: 9 mg/dL (ref 8.9–10.3)
CHLORIDE: 94 mmol/L — AB (ref 101–111)
CO2: 26 mmol/L (ref 22–32)
CREATININE: 4.09 mg/dL — AB (ref 0.44–1.00)
GFR, EST AFRICAN AMERICAN: 12 mL/min — AB (ref >60)
GFR, EST NON AFRICAN AMERICAN: 11 mL/min — AB (ref >60)
GLUCOSE: 147 mg/dL — AB (ref 65–99)
POTASSIUM: 3.8 mmol/L (ref 3.5–5.1)
Phosphorus: 4.3 mg/dL (ref 2.5–4.6)
Sodium: 134 mmol/L — ABNORMAL LOW (ref 135–145)

## 2016-11-01 LAB — CBC
HEMATOCRIT: 27 % — AB (ref 36.0–46.0)
HEMOGLOBIN: 8.8 g/dL — AB (ref 12.0–15.0)
MCH: 28.6 pg (ref 26.0–34.0)
MCHC: 32.6 g/dL (ref 30.0–36.0)
MCV: 87.7 fL (ref 78.0–100.0)
Platelets: 529 10*3/uL — ABNORMAL HIGH (ref 150–400)
RBC: 3.08 MIL/uL — AB (ref 3.87–5.11)
RDW: 16.2 % — ABNORMAL HIGH (ref 11.5–15.5)
WBC: 25.4 10*3/uL — AB (ref 4.0–10.5)

## 2016-11-01 LAB — SEDIMENTATION RATE: Sed Rate: 139 mm/hr — ABNORMAL HIGH (ref 0–22)

## 2016-11-01 LAB — C-REACTIVE PROTEIN: CRP: 29.5 mg/dL — ABNORMAL HIGH (ref <1.0)

## 2016-11-01 NOTE — Consult Note (Signed)
Chief Complaint: Patient was seen in consultation today for retroperitoneal hematoma  Referring Physician(s):  Merril Abbe  Supervising Physician: Jolaine Click  Patient Status: [facility] - Out-pt  History of Present Illness: Wanda Deleon is a 66 y.o. female with past medical history of arthritis, GERD, CKD on HD, DM2, CHF, and debility who was admitted to [facility] after recent intestinal ischemia s/p colostomy. Patient complains of back pain.   IR consulted for retroperitoneal hematoma aspiration.  Case reviewed with Dr. Bonnielee Haff who feels patient is appropriate for procedure.   Patient complains of back pain, but is confused and anxious during visit.   Past Medical History:  Diagnosis Date  . Arthritis   . Chronic kidney disease   . GERD (gastroesophageal reflux disease)     Past Surgical History:  Procedure Laterality Date  . ABDOMINAL HYSTERECTOMY    . CHOLECYSTECTOMY    . KIDNEY SURGERY      Allergies: Tape  Medications: Prior to Admission medications   Medication Sig Start Date End Date Taking? Authorizing Provider  aspirin 81 MG tablet Take 81 mg by mouth daily.    [provider]  carvedilol (COREG) 25 MG tablet  10/01/13   [provider]  cephALEXin (KEFLEX) 500 MG capsule Take 1 capsule (500 mg total) by mouth 2 (two) times daily. 06/17/14   Alvan Dame, DPM  ciprofloxacin (CIPRO) 250 MG tablet Take 1 tablet (250 mg total) by mouth 2 (two) times daily. 10/29/13   Alvan Dame, DPM  gabapentin (NEURONTIN) 300 MG capsule  09/11/13   [provider]  omega-3 acid ethyl esters (LOVAZA) 1 G capsule  07/24/13   [provider]  oxyCODONE-acetaminophen (PERCOCET/ROXICET) 5-325 MG per tablet  09/11/13   [provider]  RENVELA 800 MG tablet  09/25/13   [provider]  SENSIPAR 60 MG tablet  09/25/13   [provider]  silver sulfADIAZINE (SILVADENE) 1 % cream Apply 1 application topically daily. 06/17/14   Alvan Dame, DPM  zolpidem Remus Loffler) 5 MG tablet  09/29/13   [provider]     No family history on file.  Social History   Social History  . Marital status: Significant Other    Spouse name: N/A  . Number of children: N/A  . Years of education: N/A   Social History Main Topics  . Smoking status: Never Smoker  . Smokeless tobacco: Never Used  . Alcohol use No  . Drug use: No  . Sexual activity: Not on file   Other Topics Concern  . Not on file   Social History Narrative  . No narrative on file   Review of Systems  Constitutional: Negative for fatigue and fever.  Respiratory: Negative for cough and shortness of breath.   Cardiovascular: Negative for chest pain.  Musculoskeletal: Positive for back pain.  Psychiatric/Behavioral: Positive for confusion. Negative for behavioral problems.    Vital Signs: There were no vitals taken for this visit.  Physical Exam  Constitutional: She appears well-developed.  Cardiovascular: Normal rate, regular rhythm and normal heart sounds.   Pulmonary/Chest: Effort normal and breath sounds normal. No respiratory distress.  Abdominal: Soft. There is tenderness.  Large wound, colostomy in place  Skin: Skin is warm and dry.  Psychiatric: She has a normal mood and affect. Her behavior is normal. Judgment and thought content normal.  Nursing note and vitals reviewed.   Mallampati Score:  MD Evaluation Airway: WNL Heart: WNL Abdomen: WNL Chest/ Lungs: WNL ASA  Classification: 3 Mallampati/Airway Score: Two  Imaging: Ct Abdomen Pelvis Wo Contrast  Result Date: 10/29/2016 CLINICAL DATA:  Pt had exploratory laparotomy with right colectomy done 10/10/16. Pt denies any abdominal pain, n/v/c/d EXAM: CT ABDOMEN AND PELVIS WITHOUT CONTRAST TECHNIQUE: Multidetector CT imaging of the abdomen and pelvis was performed following the standard protocol without IV contrast. COMPARISON:  None. FINDINGS: Lower chest: Bibasilar atelectasis.  Hepatobiliary: No focal liver abnormality is seen. Status post cholecystectomy. No biliary dilatation. Pancreas: Unremarkable. No pancreatic ductal dilatation or surrounding inflammatory changes. Spleen: Normal in size without focal abnormality. Adrenals/Urinary Tract: Normal adrenal glands. Prior right nephrectomy. Severely atrophic left kidney with dystrophic calcifications and multiple cysts. 3.1 x 3.6 cm hypodense mass adjacent to the upper pole of the left kidney and along the posterior margin of the splenic vein of uncertain etiology. Bladder is unremarkable. Stomach/Bowel: Prior right colectomy. Right lower quadrant enterostomy. Large heterogeneous hyperdense fluid collection along the right pericolic gutter most consistent with a large hematoma. No pneumatosis, pneumoperitoneum or portal venous gas. No bowel dilatation to suggest obstruction. Vascular/Lymphatic: Abdominal aortic atherosclerosis. No lymphadenopathy. Reproductive: Status post hysterectomy. No adnexal masses. Other: No abdominal wall hernia. Musculoskeletal: Endplate destruction of the inferior endplate of L4 and superior endplate of L5. Broad-based disc bulge and bilateral facet arthropathy at L4-5 could resulting in severe spinal stenosis. No lytic or sclerotic osseous lesion. IMPRESSION: 1. Recent right colectomy. Large hyperdense fluid collection in the right pericolic gutter most concerning for a large hematoma. 2. Endplate destruction of the inferior endplate of L4 and superior endplate of L5 which may reflect subacute or chronic discitis/osteomyelitis. 3. Broad-based disc bulge and bilateral facet arthropathy at L4-5 could resulting in severe spinal stenosis. 4. 3.1 x 3.6 cm hypodense mass adjacent to the upper pole of the left kidney and along the posterior margin of the splenic vein of uncertain etiology. Further evaluation with a nonemergent MRI of the abdomen is recommended. Electronically Signed   By: Elige Ko   On: 10/29/2016  10:26   Mr Abdomen Wo Contrast  Result Date: 10/31/2016 CLINICAL DATA:  Evaluate indeterminate LEFT renal lesion. Of note patient is status post recent colectomy. This patient not hold breath or follow-up commands. EXAM: MRI ABDOMEN WITHOUT CONTRAST TECHNIQUE: Multiplanar multisequence MR imaging was performed without the administration of intravenous contrast. Of note patient is status post recent colectomy. This patient not hold breath or follow-up commands. COMPARISON:  None. FINDINGS: Lower chest:  Lung bases are clear. Hepatobiliary: Low signal intensity within the liver and spleen suggest iron overload. Pancreas is normal signal intensity. No biliary duct dilatation. Postcholecystectomy. The common bile duct is mildly dilated 9 mm. This is common following cholecystectomy. Pancreas: Normal pancreatic parenchyma on this noncontrast exam Spleen: Low signal intensity on T2 weighted imaging within the spleen (series 7). Within the posterior aspect the spleen measuring 3.2 cm lesion which is hyperintense T2 weighted imaging (image 17, series 7 ; image 18, series 12. Lesion does restrict diffusion (image 72, series 1250). Adrenals/urinary tract: Adrenal glands are normal. There multiple round lesions within the LEFT renal cortex which are hyperintense on T2 weighted imaging consistent with a simple cyst. The lesion of concern medial to the upper pole LEFT kidney is hyperintense T2 weighted imaging (Series 27, series 10) and mildly hyperintense on T1 weighted imaging (image 84, series 1300). This lesion measures 3.6 cm by 3.0 cm. Within the RIGHT pararenal space there is a large hematoma measuring 8.6 x 7.7 cm in diameter (image 35,  series 2) not changed from recent exam Stomach/Bowel: Stomach and limited of the small bowel is unremarkable Vascular/Lymphatic: Abdominal aortic normal caliber. No retroperitoneal periportal lymphadenopathy. Musculoskeletal: No aggressive osseous lesion IMPRESSION: 1. This inpatient  cannot hold breath or follow commands. Recent colectomy. Follow-up contrast would be more beneficial. If patient on chronic hemodialysis, iodinated contrast with CT could be considered. 2. Round lesion adjacent to the upper pole LEFT kidney is indeterminate. Differential would include solid renal mass, hemorrhagic renal cyst, or less likely vascular structure / aneurysm. Recommend follow-up contrast exam as above. 3. 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 Query transfusions. 4. A 3 cm lesion within the spleen is indeterminate. Differential would include benign splenic lesion versus abscess. Contrast imaging may aid in differential. 5. Stable large RIGHT retroperitoneal hematoma. These results will be called to the ordering clinician or representative by the Radiologist Assistant, and communication documented in the PACS or zVision Dashboard. Electronically Signed   By: Genevive BiStewart  Edmunds M.D.   On: 10/31/2016 10:38    Labs:  CBC:  Recent Labs  10/29/16 0615 10/30/16 0644 10/31/16 0538 11/01/16 0500  WBC 22.3* 21.6* 20.6* 25.4*  HGB 7.0* 8.4* 8.0* 8.8*  HCT 21.4* 25.7* 25.6* 27.0*  PLT 485* 500* 529* 529*    COAGS: No results for input(s): INR, APTT in the last 8760 hours.  BMP:  Recent Labs  10/29/16 0615 10/30/16 0643 10/31/16 0538 11/01/16 1140  NA 135 135 135 134*  K 5.4* 4.1 4.3 3.8  CL 97* 96* 96* 94*  CO2 24 25 23 26   GLUCOSE 126* 138* 133* 147*  BUN 42* 25* 36* 23*  CALCIUM 8.6* 8.5* 8.3* 9.0  CREATININE 6.69* 4.62* 6.21* 4.09*  GFRNONAA 6* 9* 6* 11*  GFRAA 7* 11* 7* 12*    LIVER FUNCTION TESTS:  Recent Labs  10/27/16 0931  10/29/16 0615 10/30/16 0643 10/31/16 0538 11/01/16 1140  BILITOT 0.4  --   --   --   --   --   AST 217*  --   --   --   --   --   ALT 218*  --   --   --   --   --   ALKPHOS 364*  --   --   --   --   --   PROT 8.9*  --   --   --   --   --   ALBUMIN 2.0*  < > 1.9* 2.0* 2.0* 2.2*  < > = values in  this interval not displayed.  TUMOR MARKERS: No results for input(s): AFPTM, CEA, CA199, CHROMGRNA in the last 8760 hours.  Assessment and Plan: Retroperitoneal hematoma Patient with persistent back pain has a stable large retroperitoneal hematoma.  Discussed with Dr. Bonnielee HaffHoss.  Hematoma is largely solid likely preventing drainage.  Dr. Bonnielee HaffHoss discussed with NP who requests aspiration if possible for diagnostic testing.  Anticipate procedure tomorrow. Orders in place for NPO.  Patient is not ordered blood thinners at this time.  Risks and benefits discussed with the patient's son including bleeding, infection, damage to adjacent structures, bowel perforation/fistula connection, and sepsis. All of the patient's son's questions were answered, patient is agreeable to proceed. Consent signed and in chart.  Thank you for this interesting consult.  I greatly enjoyed meeting Rondell ReamsGearldlyn Bernstein and look forward to participating in their care.  A copy of this report was sent to the requesting provider on this date.  Electronically Signed: Senaida OresKacie Sue-Ellen  Ashley Royalty, PA 11/01/2016, 4:07 PM   I spent a total of 40 Minutes    in face to face in clinical consultation, greater than 50% of which was counseling/coordinating care for retroperitoneal hematoma

## 2016-11-01 NOTE — Progress Notes (Signed)
Consent obtained for procedure in IR. Called [facility] to notify.   Loyce DysKacie Donalyn Schneeberger, MMS RDN PA-C 4:31 PM

## 2016-11-02 LAB — CBC
HEMATOCRIT: 26.7 % — AB (ref 36.0–46.0)
HEMOGLOBIN: 8.6 g/dL — AB (ref 12.0–15.0)
MCH: 28 pg (ref 26.0–34.0)
MCHC: 32.2 g/dL (ref 30.0–36.0)
MCV: 87 fL (ref 78.0–100.0)
PLATELETS: 557 10*3/uL — AB (ref 150–400)
RBC: 3.07 MIL/uL — AB (ref 3.87–5.11)
RDW: 16.3 % — ABNORMAL HIGH (ref 11.5–15.5)
WBC: 25.9 10*3/uL — AB (ref 4.0–10.5)

## 2016-11-02 LAB — RENAL FUNCTION PANEL
ANION GAP: 15 (ref 5–15)
Albumin: 2.2 g/dL — ABNORMAL LOW (ref 3.5–5.0)
BUN: 35 mg/dL — ABNORMAL HIGH (ref 6–20)
CHLORIDE: 94 mmol/L — AB (ref 101–111)
CO2: 25 mmol/L (ref 22–32)
CREATININE: 5.1 mg/dL — AB (ref 0.44–1.00)
Calcium: 9.2 mg/dL (ref 8.9–10.3)
GFR, EST AFRICAN AMERICAN: 9 mL/min — AB (ref >60)
GFR, EST NON AFRICAN AMERICAN: 8 mL/min — AB (ref >60)
Glucose, Bld: 133 mg/dL — ABNORMAL HIGH (ref 65–99)
POTASSIUM: 3.9 mmol/L (ref 3.5–5.1)
Phosphorus: 5.2 mg/dL — ABNORMAL HIGH (ref 2.5–4.6)
Sodium: 134 mmol/L — ABNORMAL LOW (ref 135–145)

## 2016-11-02 LAB — CULTURE, BLOOD (ROUTINE X 2)
CULTURE: NO GROWTH
Culture: NO GROWTH
Special Requests: ADEQUATE
Special Requests: ADEQUATE

## 2016-11-02 NOTE — Progress Notes (Signed)
[facility]  ROUNDING NOTE   Subjective:  Patient seen and evaluated during hemodialysis. Blood pressure a bit low and patient is receiving albumin.    Objective:  Vital signs in last 24 hours:  Temperature 97.6 pulse 96 versus 20 blood pressure 91/56  Physical Exam: General: No acute distress  Head: Normocephalic, atraumatic. Moist oral mucosal membranes  Eyes: Anicteric  Neck: Supple, trachea midline  Lungs:  Clear to auscultation, normal effort  Heart: S1S2 no rubs  Abdomen:  Soft, nontender, bowel sounds present, colostomy in place  Extremities: R BKA, trace LLE edema  Neurologic: Awake, alert, following commands  Skin: No lesions  Access: LUE AVF    Basic Metabolic Panel:  Recent Labs Lab 10/28/16 0620 10/29/16 0615 10/30/16 0643 10/31/16 0538 11/01/16 1140 11/02/16 0559  NA 135  134* 135 135 135 134* 134*  K 5.5*  5.5* 5.4* 4.1 4.3 3.8 3.9  CL 97*  96* 97* 96* 96* 94* 94*  CO2 26  25 24 25 23 26 25   GLUCOSE 145*  144* 126* 138* 133* 147* 133*  BUN 33*  33* 42* 25* 36* 23* 35*  CREATININE 4.92*  4.97* 6.69* 4.62* 6.21* 4.09* 5.10*  CALCIUM 8.9  8.9 8.6* 8.5* 8.3* 9.0 9.2  MG 2.0 2.3 2.1  --   --   --   PHOS 4.7* 5.2* 5.0* 5.4* 4.3 5.2*    Liver Function Tests:  Recent Labs Lab 10/27/16 0931  10/29/16 0615 10/30/16 0643 10/31/16 0538 11/01/16 1140 11/02/16 0559  AST 217*  --   --   --   --   --   --   ALT 218*  --   --   --   --   --   --   ALKPHOS 364*  --   --   --   --   --   --   BILITOT 0.4  --   --   --   --   --   --   PROT 8.9*  --   --   --   --   --   --   ALBUMIN 2.0*  < > 1.9* 2.0* 2.0* 2.2* 2.2*  < > = values in this interval not displayed. No results for input(s): LIPASE, AMYLASE in the last 168 hours. No results for input(s): AMMONIA in the last 168 hours.  CBC:  Recent Labs Lab 10/28/16 0620 10/29/16 0615 10/30/16 0644 10/31/16 0538 11/01/16 0500 11/02/16 0558  WBC 22.3* 22.3* 21.6* 20.6* 25.4*  25.9*  NEUTROABS 18.8* 18.4* 18.0*  --   --   --   HGB 7.3* 7.0* 8.4* 8.0* 8.8* 8.6*  HCT 22.6* 21.4* 25.7* 25.6* 27.0* 26.7*  MCV 86.9 85.9 87.4 88.9 87.7 87.0  PLT 453* 485* 500* 529* 529* 557*    Cardiac Enzymes: No results for input(s): CKTOTAL, CKMB, CKMBINDEX, TROPONINI in the last 168 hours.  BNP: Invalid input(s): POCBNP  CBG: No results for input(s): GLUCAP in the last 168 hours.  Microbiology: Results for orders placed or performed during the hospital encounter of 10/26/16  Culture, blood (routine x 2)     Status: None (Preliminary result)   Collection Time: 10/28/16  1:21 PM  Result Value Ref Range Status   Specimen Description BLOOD RIGHT ANTECUBITAL  Final   Special Requests   Final    BOTTLES DRAWN AEROBIC AND ANAEROBIC Blood Culture adequate volume   Culture NO GROWTH 4 DAYS  Final   Report Status  PENDING  Incomplete  Culture, blood (routine x 2)     Status: None (Preliminary result)   Collection Time: 10/28/16  1:31 PM  Result Value Ref Range Status   Specimen Description BLOOD RIGHT HAND  Final   Special Requests IN PEDIATRIC BOTTLE Blood Culture adequate volume  Final   Culture NO GROWTH 4 DAYS  Final   Report Status PENDING  Incomplete  C difficile quick scan w PCR reflex     Status: None   Collection Time: 10/30/16 11:53 AM  Result Value Ref Range Status   C Diff antigen NEGATIVE NEGATIVE Final   C Diff toxin NEGATIVE NEGATIVE Final   C Diff interpretation No C. difficile detected.  Final    Coagulation Studies: No results for input(s): LABPROT, INR in the last 72 hours.  Urinalysis: No results for input(s): COLORURINE, LABSPEC, PHURINE, GLUCOSEU, HGBUR, BILIRUBINUR, KETONESUR, PROTEINUR, UROBILINOGEN, NITRITE, LEUKOCYTESUR in the last 72 hours.  Invalid input(s): APPERANCEUR    Imaging: No results found.   Medications:       Assessment/ Plan:  66 y.o. female with a PMHx of intestinal ischemia status post colostomy placement, [place], anemia of chronic disease, chronic diastolic heart, GERD, diabetes mellitus type 2, debility, who was recently admitted to the hospital in [facility] [place].  As above she presented with ischemic colitis and ended up having colostomy placement with right-sided colectomy.    1. ESRD on HD MWF:  The patient was dialyzing in [facility] [place] as an outpatient.  -  Patient seen and evaluated during hemodialysis. She has been administered albumin for low blood pressure. Filtration target 1.5 kg today.  2. Anemia of chronic kidney disease. hemoglobin from 8.6. Patient has received blood confusion earlier this admission. Avoid Epogen given renal mass.  3. Secondary hyperparathyroidism. Serum phosphorus currently 5.2. Continue to periodically monitor.  4.  Hyperkalemia. Resolved.  5. Left upper pole renal mass with prior history of right nephrectomy.  MRI abdomen showed an indeterminate lesion in the left upper pole. We would advise against use of contrast given end-stage renal disease.Patient will likely need further evaluation as an outpatient with urology.  Avoid Epogen.   LOS: 0 Antoinette Borgwardt 6/22/20188:21 AM

## 2016-11-03 ENCOUNTER — Other Ambulatory Visit (HOSPITAL_COMMUNITY): Payer: Self-pay

## 2016-11-03 ENCOUNTER — Encounter: Payer: Self-pay | Admitting: *Deleted

## 2016-11-03 LAB — PTH, INTACT AND CALCIUM
CALCIUM TOTAL (PTH): 9.3 mg/dL (ref 8.7–10.3)
PTH: 411 pg/mL — AB (ref 15–65)

## 2016-11-03 MED ORDER — MIDAZOLAM HCL 2 MG/2ML IJ SOLN
INTRAMUSCULAR | Status: AC | PRN
  Administered 2016-11-03 (×2): 1 mg via INTRAVENOUS

## 2016-11-03 MED ORDER — FENTANYL CITRATE (PF) 100 MCG/2ML IJ SOLN
INTRAMUSCULAR | Status: AC | PRN
  Administered 2016-11-03 (×2): 25 ug via INTRAVENOUS

## 2016-11-03 MED ORDER — LIDOCAINE HCL (PF) 1 % IJ SOLN
INTRAMUSCULAR | Status: AC
  Filled 2016-11-03: qty 30

## 2016-11-03 MED ORDER — FENTANYL CITRATE (PF) 100 MCG/2ML IJ SOLN
INTRAMUSCULAR | Status: AC
  Filled 2016-11-03: qty 4

## 2016-11-03 MED ORDER — MIDAZOLAM HCL 2 MG/2ML IJ SOLN
INTRAMUSCULAR | Status: AC
  Filled 2016-11-03: qty 6

## 2016-11-03 NOTE — Procedures (Signed)
Rt RP fluid collection post op  S/P CT DRAIN RP ABSCESS  No comp Stable 30 cc pus aspirated cx sent Full report in pacs

## 2016-11-04 LAB — CBC WITH DIFFERENTIAL/PLATELET
Basophils Absolute: 0 10*3/uL (ref 0.0–0.1)
Basophils Relative: 0 %
Eosinophils Absolute: 0.3 10*3/uL (ref 0.0–0.7)
Eosinophils Relative: 2 %
HEMATOCRIT: 27.2 % — AB (ref 36.0–46.0)
Hemoglobin: 8.6 g/dL — ABNORMAL LOW (ref 12.0–15.0)
LYMPHS ABS: 2.5 10*3/uL (ref 0.7–4.0)
LYMPHS PCT: 13 %
MCH: 28 pg (ref 26.0–34.0)
MCHC: 31.6 g/dL (ref 30.0–36.0)
MCV: 88.6 fL (ref 78.0–100.0)
MONO ABS: 1.7 10*3/uL — AB (ref 0.1–1.0)
Monocytes Relative: 9 %
NEUTROS ABS: 15.2 10*3/uL — AB (ref 1.7–7.7)
Neutrophils Relative %: 76 %
PLATELETS: 445 10*3/uL — AB (ref 150–400)
RBC: 3.07 MIL/uL — ABNORMAL LOW (ref 3.87–5.11)
RDW: 16.5 % — ABNORMAL HIGH (ref 11.5–15.5)
WBC: 19.7 10*3/uL — ABNORMAL HIGH (ref 4.0–10.5)

## 2016-11-04 LAB — BASIC METABOLIC PANEL
Anion gap: 15 (ref 5–15)
BUN: 47 mg/dL — AB (ref 6–20)
CALCIUM: 9.5 mg/dL (ref 8.9–10.3)
CO2: 24 mmol/L (ref 22–32)
Chloride: 99 mmol/L — ABNORMAL LOW (ref 101–111)
Creatinine, Ser: 5.26 mg/dL — ABNORMAL HIGH (ref 0.44–1.00)
GFR calc Af Amer: 9 mL/min — ABNORMAL LOW (ref >60)
GFR, EST NON AFRICAN AMERICAN: 8 mL/min — AB (ref >60)
GLUCOSE: 121 mg/dL — AB (ref 65–99)
POTASSIUM: 3.7 mmol/L (ref 3.5–5.1)
Sodium: 138 mmol/L (ref 135–145)

## 2016-11-04 NOTE — Progress Notes (Signed)
Referring Physician(s): Dr Ardeth SportsmanA Hijazi  Supervising Physician: Ruel FavorsShick, Trevor  Patient Status:  [facility] - In-[facility]  Chief Complaint:  Retroperitoneal abscess/hematoma  Subjective:  Drain placed 6/23 Up in bed Seems comfortable In NAD   Allergies: Tape  Medications: Prior to Admission medications   Medication Sig Start Date End Date Taking? Authorizing Provider  aspirin 81 MG tablet Take 81 mg by mouth daily.    [provider]  carvedilol (COREG) 25 MG tablet  10/01/13   [provider]  cephALEXin (KEFLEX) 500 MG capsule Take 1 capsule (500 mg total) by mouth 2 (two) times daily. 06/17/14   Alvan DameSikora, Richard, DPM  ciprofloxacin (CIPRO) 250 MG tablet Take 1 tablet (250 mg total) by mouth 2 (two) times daily. 10/29/13   Alvan DameSikora, Richard, DPM  gabapentin (NEURONTIN) 300 MG capsule  09/11/13   [provider]  omega-3 acid ethyl esters (LOVAZA) 1 G capsule  07/24/13   [provider]  oxyCODONE-acetaminophen (PERCOCET/ROXICET) 5-325 MG per tablet  09/11/13   [provider]  RENVELA 800 MG tablet  09/25/13   [provider]  SENSIPAR 60 MG tablet  09/25/13   [provider]  silver sulfADIAZINE (SILVADENE) 1 % cream Apply 1 application topically daily. 06/17/14   Alvan DameSikora, Richard, DPM  zolpidem (AMBIEN) 5 MG tablet  09/29/13   [provider]     Vital Signs: BP (!) 89/61   Pulse 95   Resp 19   SpO2 100%   Physical Exam  Skin: Skin is warm and dry.  Skin site is clean and dry OP 500 cc yesterday per chart 50 cc in JP now Bloody and purulent Cx pending--- abundant wbcs    Imaging: Ct Image Guided Fluid Drain By Catheter  Result Date: 11/03/2016 INDICATION: Postop right retroperitoneal fluid collection concerning for hematoma or abscess EXAM: CT-GUIDED DRAINAGE OF THE RIGHT RETROPERITONEAL FLUID COLLECTION MEDICATIONS: The patient is currently admitted to the hospital and receiving intravenous antibiotics. The  antibiotics were administered within an appropriate time frame prior to the initiation of the procedure. ANESTHESIA/SEDATION: Fentanyl 50 mcg IV; Versed 2.0 mg IV Moderate Sedation Time:  17 MINUTES The patient was continuously monitored during the procedure by the interventional radiology nurse under my direct supervision. COMPLICATIONS: None immediate. PROCEDURE: Informed written consent was obtained from the patient after a thorough discussion of the procedural risks, benefits and alternatives. All questions were addressed. Maximal Sterile Barrier Technique was utilized including caps, mask, sterile gowns, sterile gloves, sterile drape, hand hygiene and skin antiseptic. A timeout was performed prior to the initiation of the procedure. Previous imaging reviewed. Preliminary ultrasound performed with the patient supine. The large right retrograde fluid collection was localized and marked. Under sterile conditions and local anesthesia, a 17 gauge 16.8 cm access needle was advanced from a right lateral abdominal approach into the retroperitoneal fluid collection. Needle position confirmed with CT. Syringe aspiration yielded purulent fluid compatible with an abscess. Guidewire inserted followed by 12 French drain. Drain catheter position confirmed with CT. Syringe aspiration yielded 30 cc purulent fluid. Sample sent for culture. Catheter secured with a Prolene suture and connected to external suction bulb. Sterile dressing applied. No immediate complication. Patient tolerated the procedure well. IMPRESSION: Successful CT-guided right retroperitoneal abscess drain insertion. Electronically Signed   By: Judie PetitM.  Shick M.D.   On: 11/03/2016 10:12    Labs:  CBC:  Recent Labs  10/31/16 0538 11/01/16 0500 11/02/16 0558 11/04/16 0610  WBC 20.6* 25.4* 25.9* 19.7*  HGB 8.0* 8.8* 8.6* 8.6*  HCT 25.6* 27.0* 26.7* 27.2*  PLT 529* 529* 557* 445*    COAGS: No results for input(s): INR, APTT in the last 8760  hours.  BMP:  Recent Labs  10/31/16 0538 11/01/16 1140 11/02/16 0558 11/02/16 0559 11/04/16 0610  NA 135 134*  --  134* 138  K 4.3 3.8  --  3.9 3.7  CL 96* 94*  --  94* 99*  CO2 23 26  --  25 24  GLUCOSE 133* 147*  --  133* 121*  BUN 36* 23*  --  35* 47*  CALCIUM 8.3* 9.0 9.3 9.2 9.5  CREATININE 6.21* 4.09*  --  5.10* 5.26*  GFRNONAA 6* 11*  --  8* 8*  GFRAA 7* 12*  --  9* 9*    LIVER FUNCTION TESTS:  Recent Labs  10/27/16 0931  10/30/16 0643 10/31/16 0538 11/01/16 1140 11/02/16 0559  BILITOT 0.4  --   --   --   --   --   AST 217*  --   --   --   --   --   ALT 218*  --   --   --   --   --   ALKPHOS 364*  --   --   --   --   --   PROT 8.9*  --   --   --   --   --   ALBUMIN 2.0*  < > 2.0* 2.0* 2.2* 2.2*  < > = values in this interval not displayed.  Assessment and Plan:  Retroperitoneal abscess Drain intact Will follow  Electronically Signed: Ishi Danser A, PA-C 11/04/2016, 1:34 PM   I spent a total of 15 Minutes at the the patient's bedside AND on the patient's hospital floor or unit, greater than 50% of which was counseling/coordinating care for RP abscess drain

## 2016-11-05 LAB — RENAL FUNCTION PANEL
ALBUMIN: 3 g/dL — AB (ref 3.5–5.0)
Anion gap: 13 (ref 5–15)
BUN: 14 mg/dL (ref 6–20)
CHLORIDE: 96 mmol/L — AB (ref 101–111)
CO2: 26 mmol/L (ref 22–32)
CREATININE: 2.28 mg/dL — AB (ref 0.44–1.00)
Calcium: 9.6 mg/dL (ref 8.9–10.3)
GFR, EST AFRICAN AMERICAN: 25 mL/min — AB (ref >60)
GFR, EST NON AFRICAN AMERICAN: 22 mL/min — AB (ref >60)
Glucose, Bld: 164 mg/dL — ABNORMAL HIGH (ref 65–99)
PHOSPHORUS: 2.1 mg/dL — AB (ref 2.5–4.6)
POTASSIUM: 3.4 mmol/L — AB (ref 3.5–5.1)
Sodium: 135 mmol/L (ref 135–145)

## 2016-11-05 LAB — CBC
HEMATOCRIT: 30.2 % — AB (ref 36.0–46.0)
Hemoglobin: 9.9 g/dL — ABNORMAL LOW (ref 12.0–15.0)
MCH: 28.4 pg (ref 26.0–34.0)
MCHC: 32.8 g/dL (ref 30.0–36.0)
MCV: 86.8 fL (ref 78.0–100.0)
Platelets: 429 10*3/uL — ABNORMAL HIGH (ref 150–400)
RBC: 3.48 MIL/uL — AB (ref 3.87–5.11)
RDW: 16.4 % — AB (ref 11.5–15.5)
WBC: 16.2 10*3/uL — AB (ref 4.0–10.5)

## 2016-11-05 NOTE — Progress Notes (Signed)
[facility]  ROUNDING NOTE   Subjective:  Patient had hemodialysis earlier today 1400 cc of fluid was removed Given iv albumin,  Also  Underwent aspiration of abscess, a JP drain was placed- growing lactobacillus   Objective:  Vital signs in last 24 hours:  Temperature 97.6 pulse 82 RR 18 blood pressure 93/53  Physical Exam: General: No acute distress  Head: Normocephalic, atraumatic. Moist oral mucosal membranes  Eyes: Anicteric  Neck: Supple,   Lungs:  Clear to auscultation, normal effort  Heart: S1S2 no rubs  Abdomen:  Soft, nontender, bowel sounds present, colostomy in place  Extremities: R BKA, trace LLE edema  Neurologic: Awake, alert, following commands  Skin: Chronic scar over amputation site  Access: LUE AVF    Basic Metabolic Panel:  Recent Labs Lab 10/30/16 0643 10/31/16 0538 11/01/16 1140  11/02/16 0559 11/04/16 0610 11/05/16 1046  NA 135 135 134*  --  134* 138 135  K 4.1 4.3 3.8  --  3.9 3.7 3.4*  CL 96* 96* 94*  --  94* 99* 96*  CO2 25 23 26   --  25 24 26   GLUCOSE 138* 133* 147*  --  133* 121* 164*  BUN 25* 36* 23*  --  35* 47* 14  CREATININE 4.62* 6.21* 4.09*  --  5.10* 5.26* 2.28*  CALCIUM 8.5* 8.3* 9.0  < > 9.2 9.5 9.6  MG 2.1  --   --   --   --   --   --   PHOS 5.0* 5.4* 4.3  --  5.2*  --  2.1*  < > = values in this interval not displayed.  Liver Function Tests:  Recent Labs Lab 10/30/16 0643 10/31/16 0538 11/01/16 1140 11/02/16 0559 11/05/16 1046  ALBUMIN 2.0* 2.0* 2.2* 2.2* 3.0*   No results for input(s): LIPASE, AMYLASE in the last 168 hours. No results for input(s): AMMONIA in the last 168 hours.  CBC:  Recent Labs Lab 10/30/16 0644 10/31/16 0538 11/01/16 0500 11/02/16 0558 11/04/16 0610 11/05/16 1045  WBC 21.6* 20.6* 25.4* 25.9* 19.7* 16.2*  NEUTROABS 18.0*  --   --   --  15.2*  --   HGB 8.4* 8.0* 8.8* 8.6* 8.6* 9.9*  HCT 25.7* 25.6* 27.0* 26.7* 27.2* 30.2*  MCV 87.4 88.9 87.7 87.0 88.6 86.8  PLT 500*  529* 529* 557* 445* 429*    Cardiac Enzymes: No results for input(s): CKTOTAL, CKMB, CKMBINDEX, TROPONINI in the last 168 hours.  BNP: Invalid input(s): POCBNP  CBG: No results for input(s): GLUCAP in the last 168 hours.  Microbiology: Results for orders placed or performed during the hospital encounter of 10/26/16  Culture, blood (routine x 2)     Status: None   Collection Time: 10/28/16  1:21 PM  Result Value Ref Range Status   Specimen Description BLOOD RIGHT ANTECUBITAL  Final   Special Requests   Final    BOTTLES DRAWN AEROBIC AND ANAEROBIC Blood Culture adequate volume   Culture NO GROWTH 5 DAYS  Final   Report Status 11/02/2016 FINAL  Final  Culture, blood (routine x 2)     Status: None   Collection Time: 10/28/16  1:31 PM  Result Value Ref Range Status   Specimen Description BLOOD RIGHT HAND  Final   Special Requests IN PEDIATRIC BOTTLE Blood Culture adequate volume  Final   Culture NO GROWTH 5 DAYS  Final   Report Status 11/02/2016 FINAL  Final  C difficile quick scan w PCR reflex  Status: None   Collection Time: 10/30/16 11:53 AM  Result Value Ref Range Status   C Diff antigen NEGATIVE NEGATIVE Final   C Diff toxin NEGATIVE NEGATIVE Final   C Diff interpretation No C. difficile detected.  Final  Aerobic/Anaerobic Culture (surgical/deep wound)     Status: None (Preliminary result)   Collection Time: 11/03/16  1:51 PM  Result Value Ref Range Status   Specimen Description ABSCESS ABDOMEN  Final   Special Requests Normal  Final   Gram Stain   Final    ABUNDANT WBC PRESENT, PREDOMINANTLY PMN ABUNDANT GRAM VARIABLE ROD    Culture   Final    ABUNDANT LACTOBACILLUS SPECIES Standardized susceptibility testing for this organism is not available.    Report Status PENDING  Incomplete    Coagulation Studies: No results for input(s): LABPROT, INR in the last 72 hours.  Urinalysis: No results for input(s): COLORURINE, LABSPEC, PHURINE, GLUCOSEU, HGBUR,  BILIRUBINUR, KETONESUR, PROTEINUR, UROBILINOGEN, NITRITE, LEUKOCYTESUR in the last 72 hours.  Invalid input(s): APPERANCEUR    Imaging: No results found.   Medications:       Assessment/ Plan:  66 y.o. female with a PMHx of intestinal ischemia status post colostomy placement, ESRD on HD in [place], [place], anemia of chronic disease, chronic diastolic heart, GERD, diabetes mellitus type 2, debility, who was recently admitted to the hospital in [facility].  As above she presented with ischemic colitis and ended up having colostomy placement with right-sided colectomy.    1. ESRD on HD MWF:  The patient was dialyzing in [place] [facility] as an outpatient.  -  UF 1400 cc today -  next treatment on wednesday  2. Anemia of chronic kidney disease. hemoglobin 9.9. Patient has received blood confusion earlier this admission.  Avoid Epogen/ESA given renal mass.  3. Secondary hyperparathyroidism.  Serum phosphorus currently 2.1. Continue to periodically monitor.  4.  Hyperkalemia. Resolved.  5. Left upper pole renal mass with prior history of right nephrectomy.  MRI abdomen showed an indeterminate lesion in the left upper pole. We would advise against use of contrast for MRI given end-stage renal disease.Patient will likely need further evaluation as an outpatient with urology.  Avoid Epogen.   LOS: 0 Wanda Deleon 6/25/20182:43 PM

## 2016-11-06 NOTE — Progress Notes (Signed)
Referring Physician(s): Dr Ardeth Sportsman  Supervising Physician: Dr Ruel Favors  Patient Status:  [facility] - In-pt  Chief Complaint:  Retroperitoneal abscess Drain placed 6/23  Subjective:  Output purulent 80 cc yesterday + lactobacillus Pt alert/oriented Says she feels some better  Allergies: Tape  Medications: Prior to Admission medications   Medication Sig Start Date End Date Taking? Authorizing Provider  aspirin 81 MG tablet Take 81 mg by mouth daily.    [provider]  carvedilol (COREG) 25 MG tablet  10/01/13   [provider]  cephALEXin (KEFLEX) 500 MG capsule Take 1 capsule (500 mg total) by mouth 2 (two) times daily. 06/17/14   Alvan Dame, DPM  ciprofloxacin (CIPRO) 250 MG tablet Take 1 tablet (250 mg total) by mouth 2 (two) times daily. 10/29/13   Alvan Dame, DPM  gabapentin (NEURONTIN) 300 MG capsule  09/11/13   [provider]  omega-3 acid ethyl esters (LOVAZA) 1 G capsule  07/24/13   [provider]  oxyCODONE-acetaminophen (PERCOCET/ROXICET) 5-325 MG per tablet  09/11/13   [provider]  RENVELA 800 MG tablet  09/25/13   [provider]  SENSIPAR 60 MG tablet  09/25/13   [provider]  silver sulfADIAZINE (SILVADENE) 1 % cream Apply 1 application topically daily. 06/17/14   Alvan Dame, DPM  zolpidem (AMBIEN) 5 MG tablet  09/29/13   [provider]     Vital Signs: BP (!) 89/61   Pulse 95   Resp 19   SpO2 100%   Physical Exam  Constitutional: She is oriented to person, place, and time.  Neurological: She is alert and oriented to person, place, and time.  Skin: Skin is warm and dry.  Skin site is clean and dry NT No bleeding OP purulent 80 cc yesterday JP open and not charge Recharged and closed tab myself + lactobacillus  Psychiatric: She has a normal mood and affect.  Nursing note and vitals reviewed.   Imaging: Ct Image Guided Fluid Drain By Catheter  Result  Date: 11/03/2016 INDICATION: Postop right retroperitoneal fluid collection concerning for hematoma or abscess EXAM: CT-GUIDED DRAINAGE OF THE RIGHT RETROPERITONEAL FLUID COLLECTION MEDICATIONS: The patient is currently admitted to the hospital and receiving intravenous antibiotics. The antibiotics were administered within an appropriate time frame prior to the initiation of the procedure. ANESTHESIA/SEDATION: Fentanyl 50 mcg IV; Versed 2.0 mg IV Moderate Sedation Time:  17 MINUTES The patient was continuously monitored during the procedure by the interventional radiology nurse under my direct supervision. COMPLICATIONS: None immediate. PROCEDURE: Informed written consent was obtained from the patient after a thorough discussion of the procedural risks, benefits and alternatives. All questions were addressed. Maximal Sterile Barrier Technique was utilized including caps, mask, sterile gowns, sterile gloves, sterile drape, hand hygiene and skin antiseptic. A timeout was performed prior to the initiation of the procedure. Previous imaging reviewed. Preliminary ultrasound performed with the patient supine. The large right retrograde fluid collection was localized and marked. Under sterile conditions and local anesthesia, a 17 gauge 16.8 cm access needle was advanced from a right lateral abdominal approach into the retroperitoneal fluid collection. Needle position confirmed with CT. Syringe aspiration yielded purulent fluid compatible with an abscess. Guidewire inserted followed by 12 French drain. Drain catheter position confirmed with CT. Syringe aspiration yielded 30 cc purulent fluid. Sample sent for culture. Catheter secured with a Prolene suture and connected to external suction bulb. Sterile dressing applied. No immediate complication. Patient tolerated the procedure well. IMPRESSION:  Successful CT-guided right retroperitoneal abscess drain insertion. Electronically Signed   By: Judie PetitM.  Shick M.D.   On: 11/03/2016  10:12    Labs:  CBC:  Recent Labs  11/01/16 0500 11/02/16 0558 11/04/16 0610 11/05/16 1045  WBC 25.4* 25.9* 19.7* 16.2*  HGB 8.8* 8.6* 8.6* 9.9*  HCT 27.0* 26.7* 27.2* 30.2*  PLT 529* 557* 445* 429*    COAGS: No results for input(s): INR, APTT in the last 8760 hours.  BMP:  Recent Labs  11/01/16 1140 11/02/16 0558 11/02/16 0559 11/04/16 0610 11/05/16 1046  NA 134*  --  134* 138 135  K 3.8  --  3.9 3.7 3.4*  CL 94*  --  94* 99* 96*  CO2 26  --  25 24 26   GLUCOSE 147*  --  133* 121* 164*  BUN 23*  --  35* 47* 14  CALCIUM 9.0 9.3 9.2 9.5 9.6  CREATININE 4.09*  --  5.10* 5.26* 2.28*  GFRNONAA 11*  --  8* 8* 22*  GFRAA 12*  --  9* 9* 25*    LIVER FUNCTION TESTS:  Recent Labs  10/27/16 0931  10/31/16 0538 11/01/16 1140 11/02/16 0559 11/05/16 1046  BILITOT 0.4  --   --   --   --   --   AST 217*  --   --   --   --   --   ALT 218*  --   --   --   --   --   ALKPHOS 364*  --   --   --   --   --   PROT 8.9*  --   --   --   --   --   ALBUMIN 2.0*  < > 2.0* 2.2* 2.2* 3.0*  < > = values in this interval not displayed.  Assessment and Plan:  Retroperitoneal abscess drain intact Will follow Will need re Ct when output minimal: less than 10 cc/24 hrs  Electronically Signed: Lasasha Brophy A, PA-C 11/06/2016, 8:09 AM   I spent a total of 15 Minutes at the the patient's bedside AND on the patient's hospital floor or unit, greater than 50% of which was counseling/coordinating care for Right RP abscess drain

## 2016-11-07 LAB — RENAL FUNCTION PANEL
ALBUMIN: 3.2 g/dL — AB (ref 3.5–5.0)
Anion gap: 17 — ABNORMAL HIGH (ref 5–15)
BUN: 20 mg/dL (ref 6–20)
CALCIUM: 10.3 mg/dL (ref 8.9–10.3)
CO2: 22 mmol/L (ref 22–32)
Chloride: 95 mmol/L — ABNORMAL LOW (ref 101–111)
Creatinine, Ser: 3.12 mg/dL — ABNORMAL HIGH (ref 0.44–1.00)
GFR calc Af Amer: 17 mL/min — ABNORMAL LOW (ref >60)
GFR, EST NON AFRICAN AMERICAN: 15 mL/min — AB (ref >60)
Glucose, Bld: 107 mg/dL — ABNORMAL HIGH (ref 65–99)
PHOSPHORUS: 4 mg/dL (ref 2.5–4.6)
Potassium: 4.5 mmol/L (ref 3.5–5.1)
SODIUM: 134 mmol/L — AB (ref 135–145)

## 2016-11-07 LAB — CBC
HCT: 39.5 % (ref 36.0–46.0)
Hemoglobin: 12.5 g/dL (ref 12.0–15.0)
MCH: 28.2 pg (ref 26.0–34.0)
MCHC: 31.6 g/dL (ref 30.0–36.0)
MCV: 89.2 fL (ref 78.0–100.0)
PLATELETS: 281 10*3/uL (ref 150–400)
RBC: 4.43 MIL/uL (ref 3.87–5.11)
RDW: 16.9 % — AB (ref 11.5–15.5)
WBC: 13 10*3/uL — AB (ref 4.0–10.5)

## 2016-11-07 NOTE — Progress Notes (Signed)
[facility]  ROUNDING NOTE   Subjective:  Patient had hemodialysis earlier today 1000 cc of fluid was removed JP drain in place- growing lactobacillus   Objective:  Vital signs in last 24 hours:  Temperature 97.7 pulse 78 RR 18 blood pressure 141/53  Physical Exam: General: No acute distress  Head: Normocephalic, atraumatic. Moist oral mucosal membranes  Eyes: Anicteric  Neck: Supple,   Lungs:  Clear to auscultation, normal effort  Heart: S1S2 no rubs  Abdomen:  Soft, nontender, bowel sounds present, colostomy in place  Extremities: R BKA, trace LLE edema  Neurologic: Awake, alert, following commands  Skin:  amputation site covered with bandage  Access: LUE AVF    Basic Metabolic Panel:  Recent Labs Lab 11/01/16 1140  11/02/16 0559 11/04/16 0610 11/05/16 1046  NA 134*  --  134* 138 135  K 3.8  --  3.9 3.7 3.4*  CL 94*  --  94* 99* 96*  CO2 26  --  25 24 26   GLUCOSE 147*  --  133* 121* 164*  BUN 23*  --  35* 47* 14  CREATININE 4.09*  --  5.10* 5.26* 2.28*  CALCIUM 9.0  < > 9.2 9.5 9.6  PHOS 4.3  --  5.2*  --  2.1*  < > = values in this interval not displayed.  Liver Function Tests:  Recent Labs Lab 11/01/16 1140 11/02/16 0559 11/05/16 1046  ALBUMIN 2.2* 2.2* 3.0*   No results for input(s): LIPASE, AMYLASE in the last 168 hours. No results for input(s): AMMONIA in the last 168 hours.  CBC:  Recent Labs Lab 11/01/16 0500 11/02/16 0558 11/04/16 0610 11/05/16 1045 11/07/16 1213  WBC 25.4* 25.9* 19.7* 16.2* 13.0*  NEUTROABS  --   --  15.2*  --   --   HGB 8.8* 8.6* 8.6* 9.9* 12.5  HCT 27.0* 26.7* 27.2* 30.2* 39.5  MCV 87.7 87.0 88.6 86.8 89.2  PLT 529* 557* 445* 429* 281    Cardiac Enzymes: No results for input(s): CKTOTAL, CKMB, CKMBINDEX, TROPONINI in the last 168 hours.  BNP: Invalid input(s): POCBNP  CBG: No results for input(s): GLUCAP in the last 168 hours.  Microbiology: Results for orders placed or performed during  the hospital encounter of 10/26/16  Culture, blood (routine x 2)     Status: None   Collection Time: 10/28/16  1:21 PM  Result Value Ref Range Status   Specimen Description BLOOD RIGHT ANTECUBITAL  Final   Special Requests   Final    BOTTLES DRAWN AEROBIC AND ANAEROBIC Blood Culture adequate volume   Culture NO GROWTH 5 DAYS  Final   Report Status 11/02/2016 FINAL  Final  Culture, blood (routine x 2)     Status: None   Collection Time: 10/28/16  1:31 PM  Result Value Ref Range Status   Specimen Description BLOOD RIGHT HAND  Final   Special Requests IN PEDIATRIC BOTTLE Blood Culture adequate volume  Final   Culture NO GROWTH 5 DAYS  Final   Report Status 11/02/2016 FINAL  Final  C difficile quick scan w PCR reflex     Status: None   Collection Time: 10/30/16 11:53 AM  Result Value Ref Range Status   C Diff antigen NEGATIVE NEGATIVE Final   C Diff toxin NEGATIVE NEGATIVE Final   C Diff interpretation No C. difficile detected.  Final  Aerobic/Anaerobic Culture (surgical/deep wound)     Status: None (Preliminary result)   Collection Time: 11/03/16  1:51 PM  Result Value Ref Range Status   Specimen Description ABSCESS ABDOMEN  Final   Special Requests Normal  Final   Gram Stain   Final    ABUNDANT WBC PRESENT, PREDOMINANTLY PMN ABUNDANT GRAM VARIABLE ROD    Culture   Final    ABUNDANT LACTOBACILLUS SPECIES Standardized susceptibility testing for this organism is not available. NO ANAEROBES ISOLATED; CULTURE IN PROGRESS FOR 5 DAYS    Report Status PENDING  Incomplete    Coagulation Studies: No results for input(s): LABPROT, INR in the last 72 hours.  Urinalysis: No results for input(s): COLORURINE, LABSPEC, PHURINE, GLUCOSEU, HGBUR, BILIRUBINUR, KETONESUR, PROTEINUR, UROBILINOGEN, NITRITE, LEUKOCYTESUR in the last 72 hours.  Invalid input(s): APPERANCEUR    Imaging: No results found.   Medications:       Assessment/ Plan:  66 y.o. female with a PMHx of intestinal  ischemia status post colostomy placement, ESRD on HD in [place], [place], anemia of chronic disease, chronic diastolic heart, GERD, diabetes mellitus type 2, debility, who was recently admitted to the hospital in [facility].  As above she presented with ischemic colitis and ended up having colostomy placement with right-sided colectomy.    1. ESRD on HD MWF:  The patient was dialyzing in [place] [facility] as an outpatient.  -  UF 1000 cc today -  next treatment on Friday. Orders prepared  2. Anemia of chronic kidney disease. hemoglobin 12.5. Patient has received blood confusion earlier this admission.  Avoid Epogen/ESA given renal mass.  3. Secondary hyperparathyroidism.  Serum phosphorus currently 2.1. Continue to periodically monitor. patient is on sensipar 120 mg daily- administer with food  4.  Hyperkalemia. Resolved.  5. Left upper pole renal mass with prior history of right nephrectomy.  MRI abdomen showed an indeterminate lesion in the left upper pole. We would advise against use of contrast for MRI given end-stage renal disease.Patient will likely need further evaluation as an outpatient with urology.  Avoid Epogen.   LOS: 0 Caitlynne Harbeck 6/27/20185:07 PM

## 2016-11-08 LAB — AEROBIC/ANAEROBIC CULTURE W GRAM STAIN (SURGICAL/DEEP WOUND)

## 2016-11-08 LAB — AEROBIC/ANAEROBIC CULTURE (SURGICAL/DEEP WOUND): SPECIAL REQUESTS: NORMAL

## 2016-11-09 LAB — CBC
HEMATOCRIT: 36.6 % (ref 36.0–46.0)
HEMOGLOBIN: 11.3 g/dL — AB (ref 12.0–15.0)
MCH: 27.6 pg (ref 26.0–34.0)
MCHC: 30.9 g/dL (ref 30.0–36.0)
MCV: 89.3 fL (ref 78.0–100.0)
Platelets: 365 10*3/uL (ref 150–400)
RBC: 4.1 MIL/uL (ref 3.87–5.11)
RDW: 17.3 % — AB (ref 11.5–15.5)
WBC: 17.1 10*3/uL — ABNORMAL HIGH (ref 4.0–10.5)

## 2016-11-09 LAB — RENAL FUNCTION PANEL
ANION GAP: 17 — AB (ref 5–15)
Albumin: 3.4 g/dL — ABNORMAL LOW (ref 3.5–5.0)
BUN: 41 mg/dL — AB (ref 6–20)
CO2: 21 mmol/L — AB (ref 22–32)
Calcium: 9.9 mg/dL (ref 8.9–10.3)
Chloride: 94 mmol/L — ABNORMAL LOW (ref 101–111)
Creatinine, Ser: 5.31 mg/dL — ABNORMAL HIGH (ref 0.44–1.00)
GFR calc Af Amer: 9 mL/min — ABNORMAL LOW (ref >60)
GFR calc non Af Amer: 8 mL/min — ABNORMAL LOW (ref >60)
Glucose, Bld: 118 mg/dL — ABNORMAL HIGH (ref 65–99)
POTASSIUM: 4.3 mmol/L (ref 3.5–5.1)
Phosphorus: 5.5 mg/dL — ABNORMAL HIGH (ref 2.5–4.6)
SODIUM: 132 mmol/L — AB (ref 135–145)

## 2016-11-09 NOTE — Progress Notes (Signed)
Referring Physician(s): DR A Hijazi  Supervising Physician: Malachy MoanMcCullough, Heath  [facility]  Chief Complaint:  Retroperitoneal abscess Drain placed 6/23  Subjective:  RP abscess drain placed 6/23 + lactobacillus OP still significant; 30 cc yesterday Flushing well per RN  Allergies: Tape  Medications: Prior to Admission medications   Medication Sig Start Date End Date Taking? Authorizing Provider  aspirin 81 MG tablet Take 81 mg by mouth daily.    [provider]  carvedilol (COREG) 25 MG tablet  10/01/13   [provider]  cephALEXin (KEFLEX) 500 MG capsule Take 1 capsule (500 mg total) by mouth 2 (two) times daily. 06/17/14   Alvan DameSikora, Richard, DPM  ciprofloxacin (CIPRO) 250 MG tablet Take 1 tablet (250 mg total) by mouth 2 (two) times daily. 10/29/13   Alvan DameSikora, Richard, DPM  gabapentin (NEURONTIN) 300 MG capsule  09/11/13   [provider]  omega-3 acid ethyl esters (LOVAZA) 1 G capsule  07/24/13   [provider]  oxyCODONE-acetaminophen (PERCOCET/ROXICET) 5-325 MG per tablet  09/11/13   [provider]  RENVELA 800 MG tablet  09/25/13   [provider]  SENSIPAR 60 MG tablet  09/25/13   [provider]  silver sulfADIAZINE (SILVADENE) 1 % cream Apply 1 application topically daily. 06/17/14   Alvan DameSikora, Richard, DPM  zolpidem (AMBIEN) 5 MG tablet  09/29/13   [provider]     Vital Signs: BP (!) 89/61   Pulse 95   Resp 19   SpO2 100%   Physical Exam  Abdominal: Soft.  Skin: Skin is warm and dry.  Site is clean and dry NT No bleeding OP bloody purulent  +lactobacillus    Imaging: No results found.  Labs:  CBC:  Recent Labs  11/04/16 0610 11/05/16 1045 11/07/16 1213 11/09/16 0547  WBC 19.7* 16.2* 13.0* 17.1*  HGB 8.6* 9.9* 12.5 11.3*  HCT 27.2* 30.2* 39.5 36.6  PLT 445* 429* 281 365    COAGS: No results for input(s): INR, APTT in the last 8760  hours.  BMP:  Recent Labs  11/04/16 0610 11/05/16 1046 11/07/16 1824 11/09/16 0547  NA 138 135 134* 132*  K 3.7 3.4* 4.5 4.3  CL 99* 96* 95* 94*  CO2 24 26 22  21*  GLUCOSE 121* 164* 107* 118*  BUN 47* 14 20 41*  CALCIUM 9.5 9.6 10.3 9.9  CREATININE 5.26* 2.28* 3.12* 5.31*  GFRNONAA 8* 22* 15* 8*  GFRAA 9* 25* 17* 9*    LIVER FUNCTION TESTS:  Recent Labs  10/27/16 0931  11/02/16 0559 11/05/16 1046 11/07/16 1824 11/09/16 0547  BILITOT 0.4  --   --   --   --   --   AST 217*  --   --   --   --   --   ALT 218*  --   --   --   --   --   ALKPHOS 364*  --   --   --   --   --   PROT 8.9*  --   --   --   --   --   ALBUMIN 2.0*  < > 2.2* 3.0* 3.2* 3.4*  < > = values in this interval not displayed.  Assessment and Plan:  Retroperitoneal abscess drain intact Will follow Will need re Ct when output minimal: less than 10 cc/24 hrs  Electronically Signed: Derita Michelsen A, PA-C 11/09/2016, 10:04 AM   I spent a total of  15 Minutes at the the patient's bedside AND on the patient's hospital floor or unit, greater than 50% of which was counseling/coordinating care for RP abscess

## 2016-11-09 NOTE — Progress Notes (Signed)
[facility]  ROUNDING NOTE   Subjective:  Patient awaiting HD today Staff reports that her appetite is very poor JP drain in place- growing lactobacillus BP low   Objective:  Vital signs in last 24 hours:  Temperature 97.9 pulse 82 RR 18 blood pressure 77/44  Physical Exam: General: No acute distress  Head: Normocephalic, atraumatic. Moist oral mucosal membranes  Eyes: Anicteric  Neck: Supple,   Lungs:  Clear to auscultation, normal effort  Heart: S1S2 no rubs  Abdomen:  Soft, nontender, bowel sounds present, colostomy in place  Extremities: R BKA, trace LLE edema  Neurologic: Awake, alert, following commands  Skin:  amputation site covered with bandage  Access: LUE AVF    Basic Metabolic Panel:  Recent Labs Lab 11/04/16 0610 11/05/16 1046 11/07/16 1824 11/09/16 0547  NA 138 135 134* 132*  K 3.7 3.4* 4.5 4.3  CL 99* 96* 95* 94*  CO2 24 26 22  21*  GLUCOSE 121* 164* 107* 118*  BUN 47* 14 20 41*  CREATININE 5.26* 2.28* 3.12* 5.31*  CALCIUM 9.5 9.6 10.3 9.9  PHOS  --  2.1* 4.0 5.5*    Liver Function Tests:  Recent Labs Lab 11/05/16 1046 11/07/16 1824 11/09/16 0547  ALBUMIN 3.0* 3.2* 3.4*   No results for input(s): LIPASE, AMYLASE in the last 168 hours. No results for input(s): AMMONIA in the last 168 hours.  CBC:  Recent Labs Lab 11/04/16 0610 11/05/16 1045 11/07/16 1213 11/09/16 0547  WBC 19.7* 16.2* 13.0* 17.1*  NEUTROABS 15.2*  --   --   --   HGB 8.6* 9.9* 12.5 11.3*  HCT 27.2* 30.2* 39.5 36.6  MCV 88.6 86.8 89.2 89.3  PLT 445* 429* 281 365    Cardiac Enzymes: No results for input(s): CKTOTAL, CKMB, CKMBINDEX, TROPONINI in the last 168 hours.  BNP: Invalid input(s): POCBNP  CBG: No results for input(s): GLUCAP in the last 168 hours.  Microbiology: Results for orders placed or performed during the hospital encounter of 10/26/16  Culture, blood (routine x 2)     Status: None   Collection Time: 10/28/16  1:21 PM  Result  Value Ref Range Status   Specimen Description BLOOD RIGHT ANTECUBITAL  Final   Special Requests   Final    BOTTLES DRAWN AEROBIC AND ANAEROBIC Blood Culture adequate volume   Culture NO GROWTH 5 DAYS  Final   Report Status 11/02/2016 FINAL  Final  Culture, blood (routine x 2)     Status: None   Collection Time: 10/28/16  1:31 PM  Result Value Ref Range Status   Specimen Description BLOOD RIGHT HAND  Final   Special Requests IN PEDIATRIC BOTTLE Blood Culture adequate volume  Final   Culture NO GROWTH 5 DAYS  Final   Report Status 11/02/2016 FINAL  Final  C difficile quick scan w PCR reflex     Status: None   Collection Time: 10/30/16 11:53 AM  Result Value Ref Range Status   C Diff antigen NEGATIVE NEGATIVE Final   C Diff toxin NEGATIVE NEGATIVE Final   C Diff interpretation No C. difficile detected.  Final  Aerobic/Anaerobic Culture (surgical/deep wound)     Status: None   Collection Time: 11/03/16  1:51 PM  Result Value Ref Range Status   Specimen Description ABSCESS ABDOMEN  Final   Special Requests Normal  Final   Gram Stain   Final    ABUNDANT WBC PRESENT, PREDOMINANTLY PMN ABUNDANT GRAM VARIABLE ROD    Culture  Final    ABUNDANT LACTOBACILLUS SPECIES Standardized susceptibility testing for this organism is not available. NO ANAEROBES ISOLATED    Report Status 11/08/2016 FINAL  Final    Coagulation Studies: No results for input(s): LABPROT, INR in the last 72 hours.  Urinalysis: No results for input(s): COLORURINE, LABSPEC, PHURINE, GLUCOSEU, HGBUR, BILIRUBINUR, KETONESUR, PROTEINUR, UROBILINOGEN, NITRITE, LEUKOCYTESUR in the last 72 hours.  Invalid input(s): APPERANCEUR    Imaging: No results found.   Medications:       Assessment/ Plan:  66 y.o. female with a PMHx of intestinal ischemia status post colostomy placement, [place], anemia of chronic disease, chronic diastolic heart, GERD, diabetes mellitus type 2, debility,  who was recently [facility] the [facility] [place].  As above she presented with ischemic colitis and ended up having colostomy placement with right-sided colectomy.    1. ESRD on HD MWF:  The patient [facility] [place] [facility] [place] [facility].  -  Minimal UF as BP is low  - awaiting HD today - add midodrine for low BP  2. Anemia of chronic kidney disease. hemoglobin 11.3. Patient has received blood transfusion earlier this admission.  Avoid Epogen/ESA given renal mass.  3. Secondary hyperparathyroidism.  Serum phosphorus currently 5.5. Continue to periodically monitor. patient is on sensipar 120 mg daily- administer with food  4.  Hyperkalemia. Resolved.  5. Left upper pole renal mass with prior history of right nephrectomy.  MRI abdomen showed an indeterminate lesion in the left upper pole. We would advise against use of contrast for MRI given end-stage renal disease.Patient will likely need further evaluation as an outpatient with urology.  Avoid Epogen.   LOS: 0 Wanda Deleon 6/29/20189:19 AM

## 2016-11-12 ENCOUNTER — Other Ambulatory Visit (HOSPITAL_COMMUNITY): Payer: Self-pay

## 2016-11-12 LAB — CBC
HEMATOCRIT: 31.5 % — AB (ref 36.0–46.0)
Hemoglobin: 9.9 g/dL — ABNORMAL LOW (ref 12.0–15.0)
MCH: 27.7 pg (ref 26.0–34.0)
MCHC: 31.4 g/dL (ref 30.0–36.0)
MCV: 88.2 fL (ref 78.0–100.0)
Platelets: 289 10*3/uL (ref 150–400)
RBC: 3.57 MIL/uL — AB (ref 3.87–5.11)
RDW: 18.3 % — ABNORMAL HIGH (ref 11.5–15.5)
WBC: 11.5 10*3/uL — AB (ref 4.0–10.5)

## 2016-11-12 LAB — RENAL FUNCTION PANEL
ALBUMIN: 3.1 g/dL — AB (ref 3.5–5.0)
ANION GAP: 13 (ref 5–15)
BUN: 8 mg/dL (ref 6–20)
CALCIUM: 9.7 mg/dL (ref 8.9–10.3)
CO2: 26 mmol/L (ref 22–32)
Chloride: 93 mmol/L — ABNORMAL LOW (ref 101–111)
Creatinine, Ser: 2.72 mg/dL — ABNORMAL HIGH (ref 0.44–1.00)
GFR, EST AFRICAN AMERICAN: 20 mL/min — AB (ref >60)
GFR, EST NON AFRICAN AMERICAN: 17 mL/min — AB (ref >60)
GLUCOSE: 118 mg/dL — AB (ref 65–99)
PHOSPHORUS: 2.5 mg/dL (ref 2.5–4.6)
POTASSIUM: 3.5 mmol/L (ref 3.5–5.1)
SODIUM: 132 mmol/L — AB (ref 135–145)

## 2016-11-12 MED ORDER — IOPAMIDOL (ISOVUE-300) INJECTION 61%
INTRAVENOUS | Status: AC
  Filled 2016-11-12: qty 100

## 2016-11-12 MED ORDER — IOPAMIDOL (ISOVUE-300) INJECTION 61%
INTRAVENOUS | Status: AC
  Filled 2016-11-12: qty 30

## 2016-11-12 NOTE — Progress Notes (Signed)
Referring Physician(s):  Dr. Sharyon Medicus  Supervising Physician: Oley Balm  Patient Status:  [facility] - In-pt  Chief Complaint: Retroperitoneal abscess s/p drain placement 6/23  Subjective: Patient tender with dressing removal.  Drain intact with continued output.  Allergies: Tape  Medications: Prior to Admission medications   Medication Sig Start Date End Date Taking? Authorizing Provider  aspirin 81 MG tablet Take 81 mg by mouth daily.    [provider]  carvedilol (COREG) 25 MG tablet  10/01/13   [provider]  cephALEXin (KEFLEX) 500 MG capsule Take 1 capsule (500 mg total) by mouth 2 (two) times daily. 06/17/14   Alvan Dame, DPM  ciprofloxacin (CIPRO) 250 MG tablet Take 1 tablet (250 mg total) by mouth 2 (two) times daily. 10/29/13   Alvan Dame, DPM  gabapentin (NEURONTIN) 300 MG capsule  09/11/13   [provider]  omega-3 acid ethyl esters (LOVAZA) 1 G capsule  07/24/13   [provider]  oxyCODONE-acetaminophen (PERCOCET/ROXICET) 5-325 MG per tablet  09/11/13   [provider]  RENVELA 800 MG tablet  09/25/13   [provider]  SENSIPAR 60 MG tablet  09/25/13   [provider]  silver sulfADIAZINE (SILVADENE) 1 % cream Apply 1 application topically daily. 06/17/14   Alvan Dame, DPM  zolpidem (AMBIEN) 5 MG tablet  09/29/13   [provider]     Vital Signs: BP (!) 89/61   Pulse 95   Resp 19   SpO2 100%   Physical Exam  NAD, alert Abdomen: soft, colostomy in place Skin: drain in place. Insertion site with drainage.  Output blood tinged.  Drain flushes easily and without pain.   Imaging: No results found.  Labs:  CBC:  Recent Labs  11/05/16 1045 11/07/16 1213 11/09/16 0547 11/12/16 0849  WBC 16.2* 13.0* 17.1* 11.5*  HGB 9.9* 12.5 11.3* 9.9*  HCT 30.2* 39.5 36.6 31.5*  PLT 429* 281 365 289    COAGS: No results for input(s): INR, APTT in the last 8760  hours.  BMP:  Recent Labs  11/05/16 1046 11/07/16 1824 11/09/16 0547 11/12/16 1416  NA 135 134* 132* 132*  K 3.4* 4.5 4.3 3.5  CL 96* 95* 94* 93*  CO2 26 22 21* 26  GLUCOSE 164* 107* 118* 118*  BUN 14 20 41* 8  CALCIUM 9.6 10.3 9.9 9.7  CREATININE 2.28* 3.12* 5.31* 2.72*  GFRNONAA 22* 15* 8* 17*  GFRAA 25* 17* 9* 20*    LIVER FUNCTION TESTS:  Recent Labs  10/27/16 0931  11/05/16 1046 11/07/16 1824 11/09/16 0547 11/12/16 1416  BILITOT 0.4  --   --   --   --   --   AST 217*  --   --   --   --   --   ALT 218*  --   --   --   --   --   ALKPHOS 364*  --   --   --   --   --   PROT 8.9*  --   --   --   --   --   ALBUMIN 2.0*  < > 3.0* 3.2* 3.4* 3.1*  < > = values in this interval not displayed.  Assessment and Plan: Retroperitoneal abscess s/p drain placement 11/03/16 Patient with continued output, now bloody Colostomy with some leakage Insertion site dirty.  Clean and replaced dressing. Continue routine drain care.  IR to follow.  Electronically Signed: Hoyt Koch, PA 11/12/2016,  3:43 PM   I spent a total of 25 Minutes at the the patient's bedside AND on the patient's hospital floor or unit, greater than 50% of which was counseling/coordinating care for retroperitoneal abscess

## 2016-11-12 NOTE — Progress Notes (Signed)
[facility]  ROUNDING NOTE   Subjective:  Patient had HD today Patient states she ate some today No fluid removed    Objective:  Vital signs in last 24 hours:  Temperature 97.1 pulse 82 RR 18 blood pressure 127/62  Physical Exam: General: No acute distress  Head: Normocephalic, atraumatic. Moist oral mucosal membranes  Eyes: Anicteric  Neck: Supple,   Lungs:  Clear to auscultation, normal effort  Heart: S1S2 no rubs  Abdomen:  Soft,  colostomy in place  Extremities: R BKA, trace LLE edema  Neurologic: Awake, alert, following commands  Skin:  amputation site covered with bandage  Access: LUE AVF    Basic Metabolic Panel:  Recent Labs Lab 11/07/16 1824 11/09/16 0547 11/12/16 1416  NA 134* 132* 132*  K 4.5 4.3 3.5  CL 95* 94* 93*  CO2 22 21* 26  GLUCOSE 107* 118* 118*  BUN 20 41* 8  CREATININE 3.12* 5.31* 2.72*  CALCIUM 10.3 9.9 9.7  PHOS 4.0 5.5* 2.5    Liver Function Tests:  Recent Labs Lab 11/07/16 1824 11/09/16 0547 11/12/16 1416  ALBUMIN 3.2* 3.4* 3.1*   No results for input(s): LIPASE, AMYLASE in the last 168 hours. No results for input(s): AMMONIA in the last 168 hours.  CBC:  Recent Labs Lab 11/07/16 1213 11/09/16 0547 11/12/16 0849  WBC 13.0* 17.1* 11.5*  HGB 12.5 11.3* 9.9*  HCT 39.5 36.6 31.5*  MCV 89.2 89.3 88.2  PLT 281 365 289    Cardiac Enzymes: No results for input(s): CKTOTAL, CKMB, CKMBINDEX, TROPONINI in the last 168 hours.  BNP: Invalid input(s): POCBNP  CBG: No results for input(s): GLUCAP in the last 168 hours.  Microbiology: Results for orders placed or performed during the hospital encounter of 10/26/16  Culture, blood (routine x 2)     Status: None   Collection Time: 10/28/16  1:21 PM  Result Value Ref Range Status   Specimen Description BLOOD RIGHT ANTECUBITAL  Final   Special Requests   Final    BOTTLES DRAWN AEROBIC AND ANAEROBIC Blood Culture adequate volume   Culture NO GROWTH 5 DAYS  Final    Report Status 11/02/2016 FINAL  Final  Culture, blood (routine x 2)     Status: None   Collection Time: 10/28/16  1:31 PM  Result Value Ref Range Status   Specimen Description BLOOD RIGHT HAND  Final   Special Requests IN PEDIATRIC BOTTLE Blood Culture adequate volume  Final   Culture NO GROWTH 5 DAYS  Final   Report Status 11/02/2016 FINAL  Final  C difficile quick scan w PCR reflex     Status: None   Collection Time: 10/30/16 11:53 AM  Result Value Ref Range Status   C Diff antigen NEGATIVE NEGATIVE Final   C Diff toxin NEGATIVE NEGATIVE Final   C Diff interpretation No C. difficile detected.  Final  Aerobic/Anaerobic Culture (surgical/deep wound)     Status: None   Collection Time: 11/03/16  1:51 PM  Result Value Ref Range Status   Specimen Description ABSCESS ABDOMEN  Final   Special Requests Normal  Final   Gram Stain   Final    ABUNDANT WBC PRESENT, PREDOMINANTLY PMN ABUNDANT GRAM VARIABLE ROD    Culture   Final    ABUNDANT LACTOBACILLUS SPECIES Standardized susceptibility testing for this organism is not available. NO ANAEROBES ISOLATED    Report Status 11/08/2016 FINAL  Final    Coagulation Studies: No results for input(s): LABPROT, INR in the  last 72 hours.  Urinalysis: No results for input(s): COLORURINE, LABSPEC, PHURINE, GLUCOSEU, HGBUR, BILIRUBINUR, KETONESUR, PROTEINUR, UROBILINOGEN, NITRITE, LEUKOCYTESUR in the last 72 hours.  Invalid input(s): APPERANCEUR    Imaging: No results found.   Medications:       Assessment/ Plan:  66 y.o. female with a PMHx of intestinal ischemia status post colostomy placement, [place], anemia of chronic disease, chronic diastolic heart, GERD, diabetes mellitus type 2, debility, who was recently [facility] to the [facility] [place].  As above she presented with ischemic colitis and ended up having colostomy placement with right-sided colectomy.    1. ESRD on HD MWF:   The [place] [facility] [place].  -  Minimal UF as BP has been low - continue midodrine for low BP - Patient expected to be d/c tomorrow  2. Anemia of chronic kidney disease. hemoglobin 9.3. Patient has received blood transfusion earlier this admission.  Avoid Epogen/ESA given renal mass.  3. Secondary hyperparathyroidism.  Serum phosphorus currently 2.5. Continue to periodically monitor. patient is on sensipar 120 mg daily- administer with food  4.  Hyperkalemia. Resolved.  5. Left upper pole renal mass with prior history of right nephrectomy.  MRI abdomen showed an indeterminate lesion in the left upper pole. We would advise against use of contrast for MRI given end-stage renal disease.Patient will likely need further evaluation as an outpatient with urology.  Avoid Epogen.   LOS: 0 Sheritha Louis 7/2/20183:55 PM

## 2016-11-13 ENCOUNTER — Other Ambulatory Visit (HOSPITAL_COMMUNITY): Payer: Self-pay

## 2016-11-13 NOTE — Progress Notes (Signed)
Patient ID: Wanda ReamsGearldlyn Deleon, female   DOB: 07/05/1950, 66 y.o.   MRN: [id number]   Request made for removal of abscess drain   CT today IMPRESSION: 1. Drain in the right pericolic gutter. No appreciable fluid collection surrounding the drain. Small volume of peritoneal ascites concentrated in right hemiabdomen without a clear organization into fluid collections. Suboptimal assessment for abscess in the absence of intravenous contrast. 2. Mild diffuse wall thickening of the residual colon may represent colitis. 3. 3.5 cm indeterminate mass medial to the upper pole of the left kidney is stable.   Afeb Wbc 11.5 Output minimal Culture ABUNDANT LACTOBACILLUS SPECIES  Standardized susceptibility testing for this organism is not available.  NO ANAEROBES ISOLATED     Pt transferring out of [facility] per RN  Removed drain per Dr Fredia SorrowYamagata No complication

## 2017-10-17 IMAGING — MR MR ABDOMEN W/O CM
7 of 11 series · 24 of 48 positions shown · non-contrast
Comparison: None.

CLINICAL DATA: Evaluate indeterminate LEFT renal lesion. Of note
patient is status post recent colectomy. This patient not hold
breath or follow-up commands.

EXAM:
MRI ABDOMEN WITHOUT CONTRAST
TECHNIQUE: Multiplanar multisequence MR imaging was performed without the
administration of intravenous contrast.
Of note patient is status post recent colectomy. This patient not
hold breath or follow-up commands.

[Series 5: cor ssfse nav · coronal · 6.0mm · 0.90mm/px · 1 of 22 slices shown (1 of 2)]
[im 1/22]
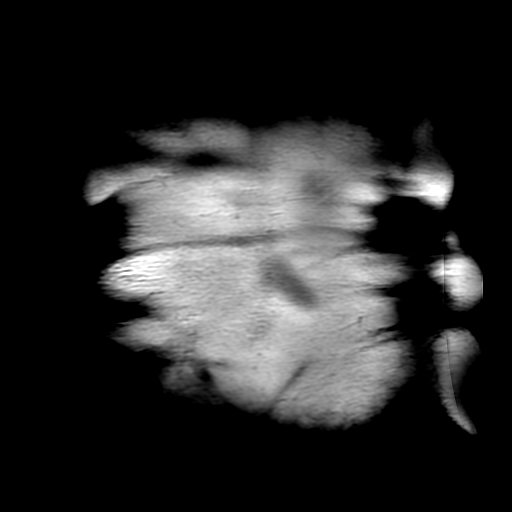

[Series 7: ax ssfse nav · axial · 6.0mm · 0.86mm/px · z∈[+46,+364]mm · 3 of 54 slices shown]
[im 1/54]
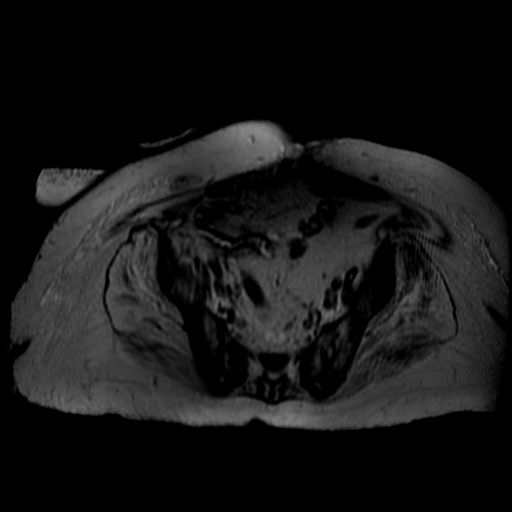
[im 27/54]
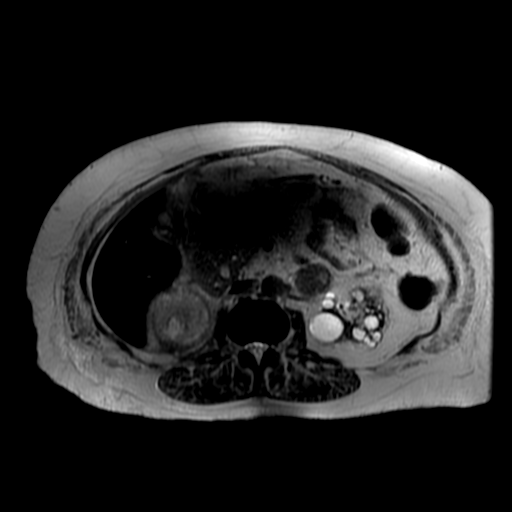
[im 54/54]
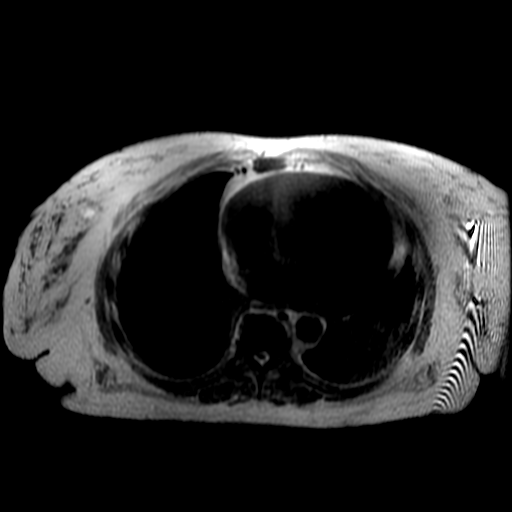

[Series 8: cor ssfse nav · coronal · 6.0mm · 0.90mm/px · 2 of 41 slices shown (2 of 2)]
[im 1/41]
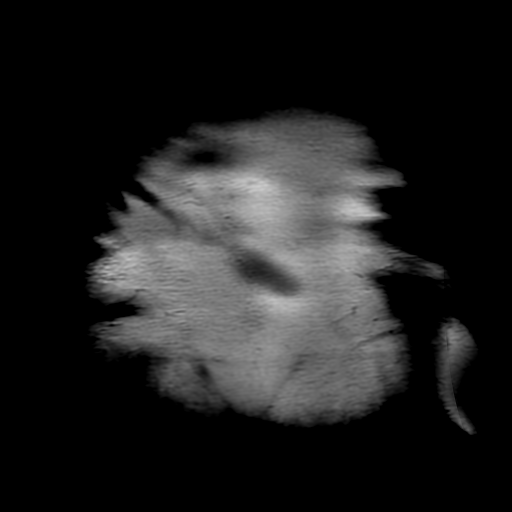
[im 41/41]
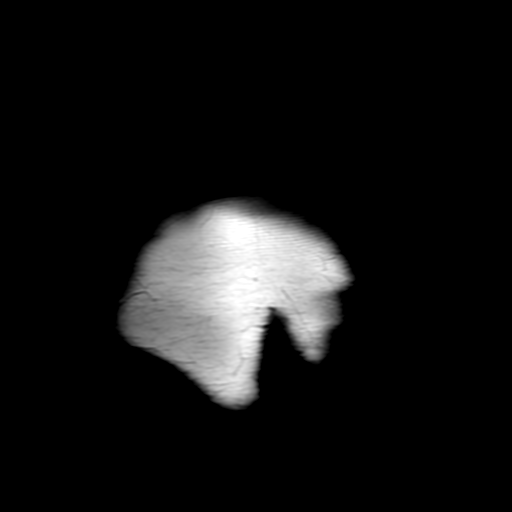

[Series 9: T1 dynamic · axial · 3.8mm · 0.90mm/px · z∈[+45,+375]mm · 5 of 88 slices shown (1 of 2)]
[im 1/88]
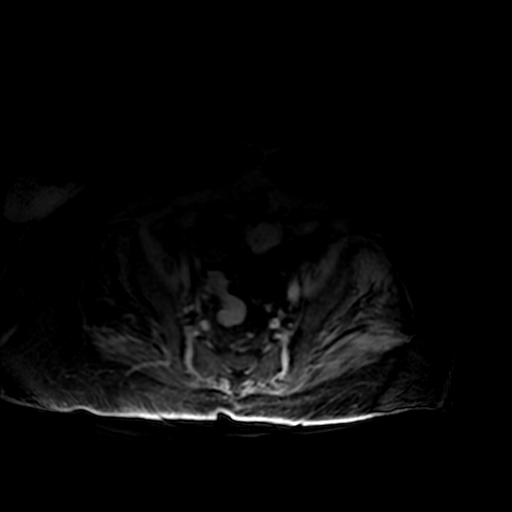
[im 22/88]
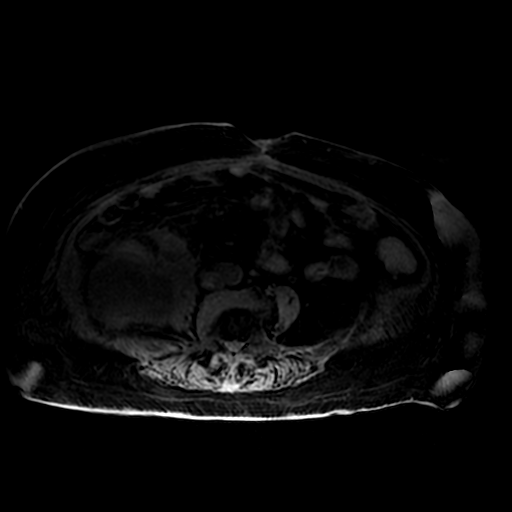
[im 44/88]
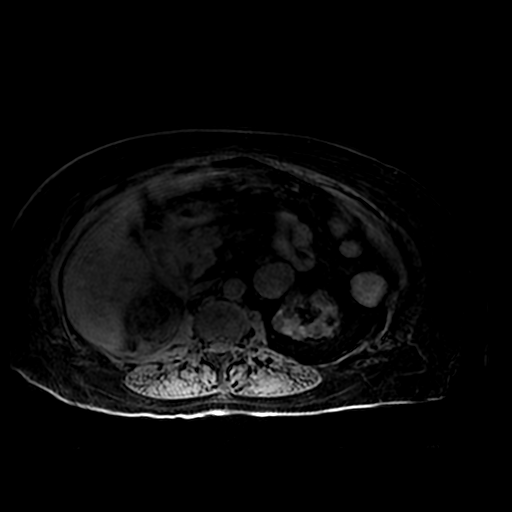
[im 66/88]
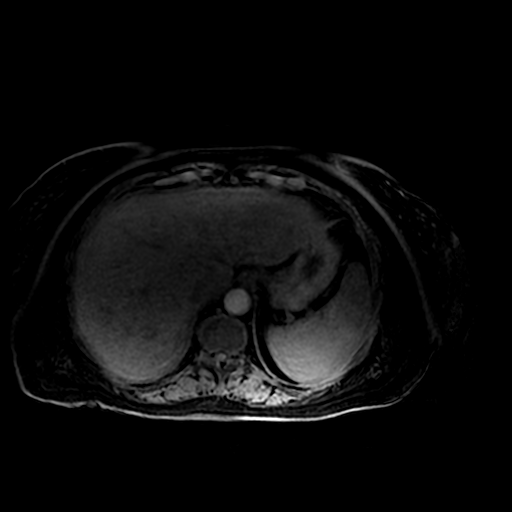
[im 88/88]
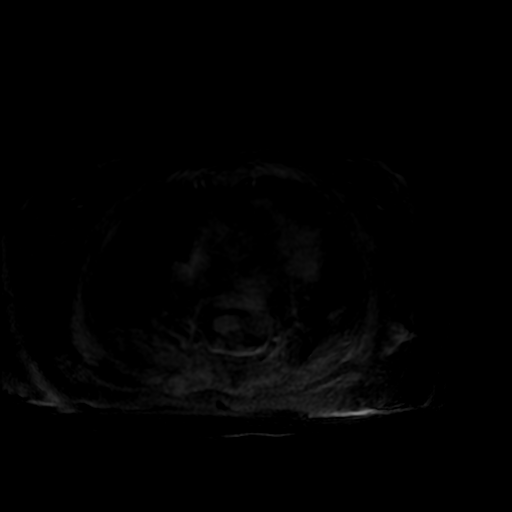

[Series 10: T2 fat-sat · axial · 6.0mm · 1.80mm/px · z∈[+40,+370]mm · 3 of 56 slices shown]
[im 1/56]
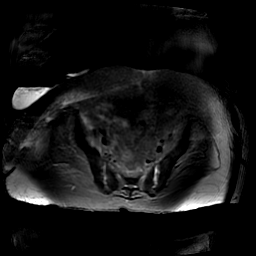
[im 28/56]
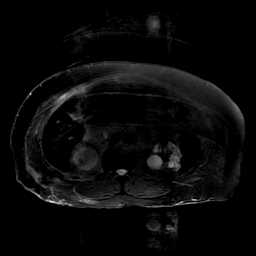
[im 56/56]
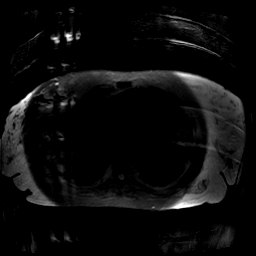

[Series 12: DWI b500 · axial · 6.0mm · 1.80mm/px · z∈[+43,+373]mm · 6 of 112 slices shown]
[im 1/112]
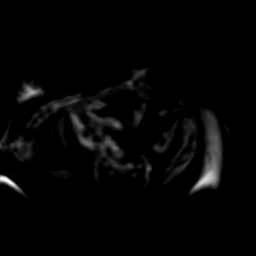
[im 23/112]
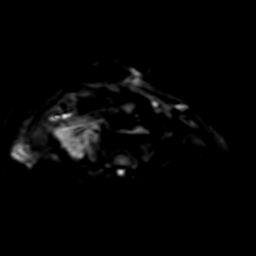
[im 45/112]
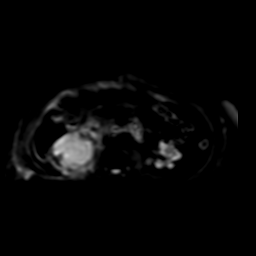
[im 67/112]
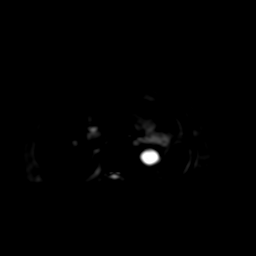
[im 89/112]
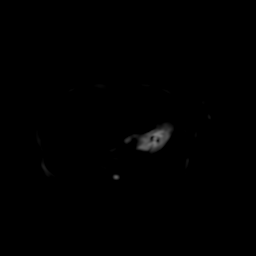
[im 112/112]
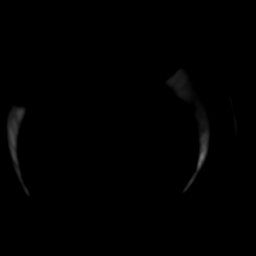

[Series 900: T1 dynamic · axial · 3.8mm · 0.90mm/px · z∈[+45,+292]mm · 4 of 88 slices shown (2 of 2)]
[im 1/88]
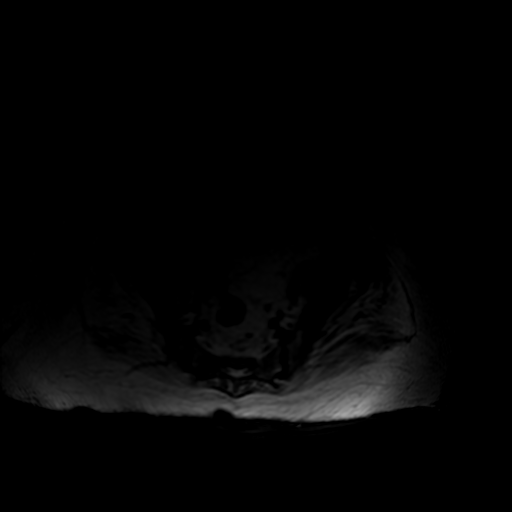
[im 22/88]
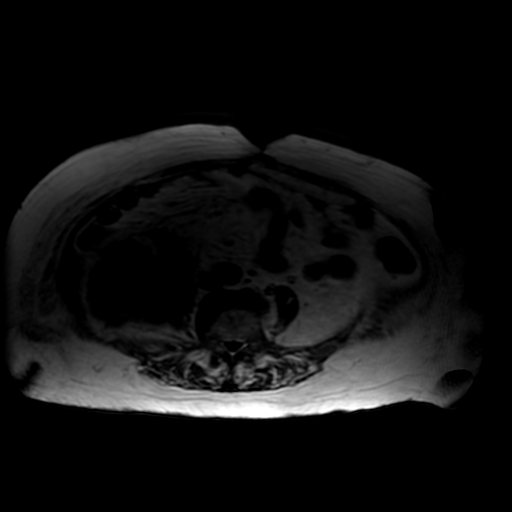
[im 44/88]
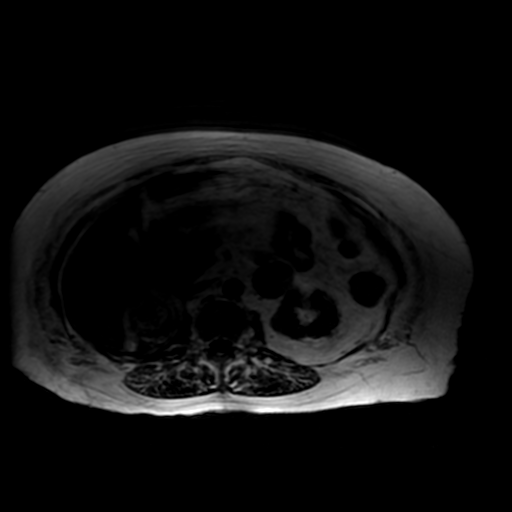
[im 66/88]
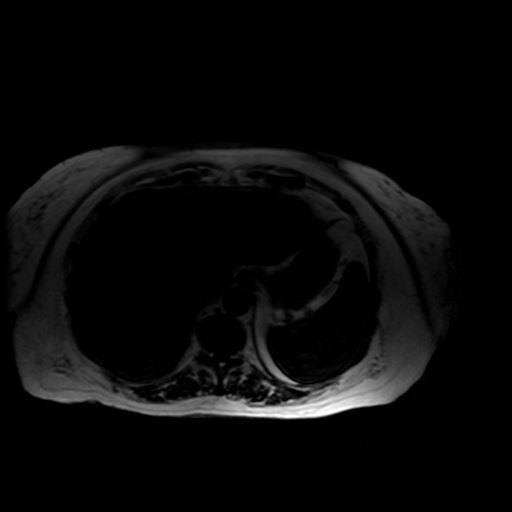

[24 of 48 positions shown; findings below may reference images not displayed]

FINDINGS: Lower chest:  Lung bases are clear.

Hepatobiliary: Low signal intensity within the liver and spleen
suggest iron overload. Pancreas is normal signal intensity. No
biliary duct dilatation. Postcholecystectomy. The common bile duct
is mildly dilated 9 mm. This is common following cholecystectomy.

Pancreas: Normal pancreatic parenchyma on this noncontrast exam

Spleen: Low signal intensity on T2 weighted imaging within the
spleen (series 7). Within the posterior aspect the spleen measuring
3.2 cm lesion which is hyperintense T2 weighted imaging (image 17,
series 7 ; image 18, series 12. Lesion does restrict diffusion
(image 72, series 8125).

Adrenals/urinary tract: Adrenal glands are normal. There multiple
round lesions within the LEFT renal cortex which are hyperintense on
T2 weighted imaging consistent with a simple cyst. The lesion of
concern medial to the upper pole LEFT kidney is hyperintense T2
weighted imaging (Series 27, series 10) and mildly hyperintense on
T1 weighted imaging (image 84, series 3644). This lesion measures
3.6 cm by 3.0 cm.

Within the RIGHT pararenal space there is a large hematoma measuring
8.6 x 7.7 cm in diameter (image 35, series 2) not changed from
recent exam

Stomach/Bowel: Stomach and limited of the small bowel is
unremarkable

Vascular/Lymphatic: Abdominal aortic normal caliber. No
retroperitoneal periportal lymphadenopathy.

Musculoskeletal: No aggressive osseous lesion
IMPRESSION: 1. This inpatient cannot hold breath or follow commands. Recent
colectomy. Follow-up contrast would be more beneficial. If patient
on chronic hemodialysis, iodinated contrast with CT could be
considered.
2. Round lesion adjacent to the upper pole LEFT kidney is
indeterminate. Differential would include solid renal mass,
hemorrhagic renal cyst, or less likely vascular structure /
aneurysm. Recommend follow-up contrast exam as above.
3. Iron deposition within the liver and spleen suggest secondary
hemochromatosis. Query transfusions.
4. A 3 cm lesion within the spleen is indeterminate. Differential
would include benign splenic lesion versus abscess. Contrast imaging
may aid in differential.
5. Stable large RIGHT retroperitoneal hematoma.
These results will be called to the ordering clinician or
representative by the Radiologist Assistant, and communication
documented in the PACS or zVision Dashboard.

## 2020-03-14 DEATH — deceased
